# Patient Record
Sex: Female | Born: 1995 | Race: White | Hispanic: No | Marital: Single | Smoking: Current every day smoker
Health system: Southern US, Community
[De-identification: ages and names within clinical notes are randomized; demographics above are authoritative.]

## PROBLEM LIST (undated history)

## (undated) DIAGNOSIS — Z789 Other specified health status: Secondary | ICD-10-CM

## (undated) HISTORY — PX: NO PAST SURGERIES: SHX2092

## (undated) HISTORY — DX: Other specified health status: Z78.9

---
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
## 2014-01-02 ENCOUNTER — Ambulatory Visit (INDEPENDENT_AMBULATORY_CARE_PROVIDER_SITE_OTHER): Payer: Medicaid Other | Admitting: Obstetrics & Gynecology

## 2014-01-02 VITALS — BP 118/64 | Wt 136.0 lb

## 2014-01-02 DIAGNOSIS — Z1389 Encounter for screening for other disorder: Secondary | ICD-10-CM

## 2014-01-02 DIAGNOSIS — Z3400 Encounter for supervision of normal first pregnancy, unspecified trimester: Secondary | ICD-10-CM

## 2014-01-02 DIAGNOSIS — Z331 Pregnant state, incidental: Secondary | ICD-10-CM

## 2014-01-02 LAB — POCT URINALYSIS DIPSTICK
Glucose, UA: NEGATIVE
Ketones, UA: NEGATIVE
Leukocytes, UA: NEGATIVE
Nitrite, UA: NEGATIVE
Protein, UA: NEGATIVE
RBC UA: NEGATIVE

## 2014-01-02 MED ORDER — CONCEPT DHA 53.5-38-1 MG PO CAPS: 1.0000 | ORAL_CAPSULE | Freq: Every day | ORAL | Status: DC

## 2014-01-02 NOTE — Progress Notes (Signed)
BP weight and urine results all reviewed and noted. Patient reports good fetal movement, denies any bleeding and no rupture of membranes symptoms or regular contractions. Patient is without complaints. All questions were answered.  

## 2014-01-23 ENCOUNTER — Other Ambulatory Visit: Payer: Medicaid Other

## 2014-01-23 ENCOUNTER — Encounter: Payer: Medicaid Other | Admitting: Obstetrics and Gynecology

## 2014-02-11 ENCOUNTER — Other Ambulatory Visit: Payer: Medicaid Other

## 2014-02-11 ENCOUNTER — Ambulatory Visit (INDEPENDENT_AMBULATORY_CARE_PROVIDER_SITE_OTHER): Payer: Medicaid Other | Admitting: Advanced Practice Midwife

## 2014-02-11 ENCOUNTER — Encounter: Payer: Self-pay | Admitting: Advanced Practice Midwife

## 2014-02-11 VITALS — BP 104/84 | Wt 143.0 lb

## 2014-02-11 DIAGNOSIS — Z3400 Encounter for supervision of normal first pregnancy, unspecified trimester: Secondary | ICD-10-CM

## 2014-02-11 DIAGNOSIS — R42 Dizziness and giddiness: Secondary | ICD-10-CM

## 2014-02-11 DIAGNOSIS — Z131 Encounter for screening for diabetes mellitus: Secondary | ICD-10-CM

## 2014-02-11 DIAGNOSIS — Z1389 Encounter for screening for other disorder: Secondary | ICD-10-CM

## 2014-02-11 DIAGNOSIS — Z331 Pregnant state, incidental: Secondary | ICD-10-CM

## 2014-02-11 LAB — POCT URINALYSIS DIPSTICK
GLUCOSE UA: NEGATIVE
Ketones, UA: NEGATIVE
LEUKOCYTES UA: NEGATIVE
NITRITE UA: NEGATIVE

## 2014-02-11 LAB — CBC
HEMATOCRIT: 34.6 % — AB (ref 36.0–46.0)
Hemoglobin: 12.1 g/dL (ref 12.0–15.0)
MCH: 32.6 pg (ref 26.0–34.0)
MCHC: 35 g/dL (ref 30.0–36.0)
MCV: 93.3 fL (ref 78.0–100.0)
Platelets: 278 10*3/uL (ref 150–400)
RBC: 3.71 MIL/uL — ABNORMAL LOW (ref 3.87–5.11)
RDW: 14 % (ref 11.5–15.5)
WBC: 14.5 10*3/uL — ABNORMAL HIGH (ref 4.0–10.5)

## 2014-02-11 LAB — GLUCOSE, POCT (MANUAL RESULT ENTRY): POC GLUCOSE: 84 mg/dL (ref 70–99)

## 2014-02-11 NOTE — Progress Notes (Signed)
Doing PN2.    No c/o at this time except round ligament pain.  Eats "all the time"  Discussed weight gain.  Routine questions about pregnancy answered.  F/U in 2 weeks for Low-risk ob appt .

## 2014-02-12 LAB — HIV ANTIBODY (ROUTINE TESTING W REFLEX): HIV 1&2 Ab, 4th Generation: NONREACTIVE

## 2014-02-12 LAB — GLUCOSE TOLERANCE, 2 HOURS W/ 1HR
Glucose, 1 hour: 136 mg/dL (ref 70–170)
Glucose, 2 hour: 89 mg/dL (ref 70–139)
Glucose, Fasting: 79 mg/dL (ref 70–99)

## 2014-02-12 LAB — ANTIBODY SCREEN: ANTIBODY SCREEN: NEGATIVE

## 2014-02-12 LAB — HSV 2 ANTIBODY, IGG: HSV 2 GLYCOPROTEIN G AB, IGG: 0.13 IV

## 2014-02-12 LAB — RPR

## 2014-02-13 ENCOUNTER — Encounter: Payer: Self-pay | Admitting: Advanced Practice Midwife

## 2014-02-20 ENCOUNTER — Encounter (HOSPITAL_COMMUNITY): Payer: Self-pay | Admitting: Emergency Medicine

## 2014-02-20 ENCOUNTER — Emergency Department (HOSPITAL_COMMUNITY)
Admission: EM | Admit: 2014-02-20 | Discharge: 2014-02-20 | Disposition: A | Discharge status: Home / Self Care | Payer: Medicaid Other | Attending: Emergency Medicine | Admitting: Emergency Medicine

## 2014-02-20 DIAGNOSIS — M545 Low back pain, unspecified: Secondary | ICD-10-CM

## 2014-02-20 DIAGNOSIS — O9989 Other specified diseases and conditions complicating pregnancy, childbirth and the puerperium: Principal | ICD-10-CM

## 2014-02-20 DIAGNOSIS — M259 Joint disorder, unspecified: Secondary | ICD-10-CM | POA: Insufficient documentation

## 2014-02-20 DIAGNOSIS — Z79899 Other long term (current) drug therapy: Secondary | ICD-10-CM | POA: Insufficient documentation

## 2014-02-20 DIAGNOSIS — Z3490 Encounter for supervision of normal pregnancy, unspecified, unspecified trimester: Secondary | ICD-10-CM

## 2014-02-20 DIAGNOSIS — M899 Disorder of bone, unspecified: Principal | ICD-10-CM | POA: Insufficient documentation

## 2014-02-20 DIAGNOSIS — O99330 Smoking (tobacco) complicating pregnancy, unspecified trimester: Secondary | ICD-10-CM | POA: Insufficient documentation

## 2014-02-20 MED ORDER — CYCLOBENZAPRINE HCL 10 MG PO TABS: 10.0000 mg | ORAL_TABLET | Freq: Two times a day (BID) | ORAL | Status: DC | PRN

## 2014-02-20 NOTE — Discharge Instructions (Signed)

## 2014-02-20 NOTE — ED Notes (Signed)
Pt. Placed on fetal monitor. [facility] On monitor.

## 2014-02-20 NOTE — ED Notes (Addendum)
Pt. Reports waking up with lower back pain. Pt. Reports sharp pain to lower back. Pt. Reports baby is moving. Pt. Denies nausea/vomiting/ diarrhea.

## 2014-02-20 NOTE — ED Notes (Addendum)
Pt. Is pregnant due August 5th. Pt. Reports waking up with back pain and abdominal cramping.

## 2014-02-20 NOTE — ED Provider Notes (Signed)
CSN: [id number]     Arrival date & time 02/20/14  0428 History   First MD Initiated Contact with Patient 02/20/14 0441     Chief Complaint  Patient presents with  . Back Pain     (Consider location/radiation/quality/duration/timing/severity/associated sxs/prior Treatment) HPI Patient states she woke one hour ago with left-sided lower back spasms. She describes them as sharp. She is roughly [redacted] weeks pregnant. She's had no nausea, vomiting or diarrhea. She denies any loss of fluid or vaginal bleeding. Fetal movements were noted earlier today. She's had no numbness or weakness. Denies any urinary symptoms. No fevers or chills.  Past Medical History  Diagnosis Date  . Medical history non-contributory    Past Surgical History  Procedure Laterality Date  . No past surgeries     Family History  Problem Relation Age of Onset  . Cancer Maternal Grandmother    History  Substance Use Topics  . Smoking status: Current Every Day Smoker -- 0.25 packs/day for 5 years  . Smokeless tobacco: Never Used  . Alcohol Use: No   OB History   Grav Para Term Preterm Abortions TAB SAB Ect Mult Living   1              Review of Systems  Constitutional: Negative for fever and chills.  Gastrointestinal: Negative for nausea, vomiting, abdominal pain and diarrhea.  Genitourinary: Negative for dysuria, vaginal bleeding, vaginal discharge and pelvic pain.  Musculoskeletal: Positive for back pain. Negative for neck pain and neck stiffness.  Skin: Negative for rash and wound.  All other systems reviewed and are negative.     Allergies  Review of patient's allergies indicates no known allergies.  Home Medications   Prior to Admission medications   Medication Sig Start Date End Date Taking? Authorizing Provider  Doxylamine-Pyridoxine (DICLEGIS) 10-10 MG TBEC Take 10 mg by mouth See admin instructions. 10/07/13   Marge Duncans, CNM  Prenat-FeFum-Doc-FA-DHA w/o A (NEXA PLUS PO) Take by mouth  daily.    Historical Provider, MD  Prenat-FeFum-FePo-FA-Omega 3 (CONCEPT DHA) 53.5-38-1 MG CAPS Take 1 tablet by mouth daily. 01/02/14   Lazaro Arms, MD  Prenat-FeFum-FePo-FA-Omega 3 (CONCEPT DHA) 53.5-38-1 MG CAPS Take 1 tablet by mouth daily. 01/02/14   Lazaro Arms, MD   BP 133/79  Pulse 103  Temp(Src) 97.5 F (36.4 C) (Oral)  Resp 18  SpO2 100%  LMP 07/03/2013 Physical Exam  Nursing note and vitals reviewed. Constitutional: She is oriented to person, place, and time. She appears well-developed and well-nourished. No distress.  HENT:  Head: Normocephalic and atraumatic.  Mouth/Throat: Oropharynx is clear and moist.  Eyes: EOM are normal. Pupils are equal, round, and reactive to light.  Neck: Normal range of motion. Neck supple.  Cardiovascular: Normal rate and regular rhythm.   Pulmonary/Chest: Effort normal and breath sounds normal. No respiratory distress. She has no wheezes. She has no rales.  Abdominal: Soft. Bowel sounds are normal. She exhibits no distension and no mass. There is no tenderness. There is no rebound and no guarding.  Gravid abdomen  Genitourinary:  Cervix closed and thick by sterile manual exam.  Musculoskeletal: Normal range of motion. She exhibits no edema and no tenderness.  No lumbar tenderness or CVA tenderness bilaterally  Neurological: She is alert and oriented to person, place, and time.  Moves all extremities without deficit. Sensation grossly intact  Skin: Skin is warm and dry. No rash noted. No erythema.  Psychiatric: She has a normal mood and  affect. Her behavior is normal.    ED Course  Procedures (including critical care time) Labs Review Labs Reviewed - No data to display  Imaging Review No results found.   EKG Interpretation None      MDM   Final diagnoses:  None    Fetal heart tones in the 130's. Pain is currently resolved.  Discussed with Dr.Eure. Recommends discharge home with Flexeril. Followup with her  OB/GYN.  Return precautions given to patient. She voiced understanding.  Loren Raceravid [place] Trawick, MD 02/20/14 [phone]

## 2014-02-20 NOTE — ED Notes (Signed)
Spoke with Olegario Messier from [facility] hospital, stated that fetal heart tracing looked good and baby is reactive. EDP notified.

## 2014-02-20 NOTE — ED Notes (Signed)
Paged Dr. Despina Hidden for Dr. Ranae Palms

## 2014-02-26 ENCOUNTER — Encounter: Payer: Medicaid Other | Admitting: Women's Health

## 2014-03-21 ENCOUNTER — Encounter: Payer: Self-pay | Admitting: Obstetrics and Gynecology

## 2014-03-21 ENCOUNTER — Ambulatory Visit (INDEPENDENT_AMBULATORY_CARE_PROVIDER_SITE_OTHER): Payer: Medicaid Other | Admitting: Obstetrics and Gynecology

## 2014-03-21 VITALS — BP 102/76 | Wt 145.0 lb

## 2014-03-21 DIAGNOSIS — F1920 Other psychoactive substance dependence, uncomplicated: Secondary | ICD-10-CM

## 2014-03-21 DIAGNOSIS — O99320 Drug use complicating pregnancy, unspecified trimester: Secondary | ICD-10-CM

## 2014-03-21 DIAGNOSIS — Z3400 Encounter for supervision of normal first pregnancy, unspecified trimester: Secondary | ICD-10-CM

## 2014-03-21 DIAGNOSIS — Z331 Pregnant state, incidental: Secondary | ICD-10-CM | POA: Insufficient documentation

## 2014-03-21 DIAGNOSIS — Z1389 Encounter for screening for other disorder: Secondary | ICD-10-CM

## 2014-03-21 DIAGNOSIS — Z3403 Encounter for supervision of normal first pregnancy, third trimester: Secondary | ICD-10-CM

## 2014-03-21 LAB — POCT URINALYSIS DIPSTICK
Glucose, UA: NEGATIVE
Ketones, UA: NEGATIVE
Leukocytes, UA: NEGATIVE
NITRITE UA: NEGATIVE
PROTEIN UA: NEGATIVE
RBC UA: NEGATIVE

## 2014-03-21 NOTE — Progress Notes (Signed)
G1P0 3614w2d Estimated Date of Delivery: 04/16/14  Blood pressure 102/76, weight 145 lb (65.772 kg), last menstrual period 07/03/2013.   BP weight and urine results all reviewed and noted Please refer to the obstetrical flow sheet for FHT, FHR, U/A results  Patient reports good fetal movement, denies any bleeding and no rupture of membranes symptoms or regular contractions. Patient is without complaints. All questions were answered. Tour of [employer] advised.   Plan:  Continued routine obstetrical care. Counseled patient on [place] pregnancy classes and [facility] of [facility] encouraged  Follow up in 1 week for OB appointment and examination of cervix, ;gbs, GC/Chl.

## 2014-03-21 NOTE — Progress Notes (Signed)
Pt denies any problems or concerns at this time.  

## 2014-03-21 NOTE — Patient Instructions (Signed)
[facility] 336- [phone] [address], [place], [place] [address]  [email or web address]

## 2014-03-22 LAB — DRUG SCREEN, URINE, NO CONFIRMATION
Amphetamine Screen, Ur: NEGATIVE
BARBITURATE QUANT UR: NEGATIVE
Benzodiazepines.: NEGATIVE
Cocaine Metabolites: NEGATIVE
Creatinine,U: 150.4 mg/dL
METHADONE: NEGATIVE
Marijuana Metabolite: POSITIVE — AB
OPIATE SCREEN, URINE: NEGATIVE
PROPOXYPHENE: NEGATIVE
Phencyclidine (PCP): NEGATIVE

## 2014-03-22 LAB — OXYCODONE SCREEN, UA, RFLX CONFIRM: Oxycodone Screen, Ur: NEGATIVE ng/mL

## 2014-03-31 ENCOUNTER — Ambulatory Visit (INDEPENDENT_AMBULATORY_CARE_PROVIDER_SITE_OTHER): Payer: Medicaid Other | Admitting: Obstetrics and Gynecology

## 2014-03-31 ENCOUNTER — Encounter: Payer: Self-pay | Admitting: Obstetrics and Gynecology

## 2014-03-31 VITALS — BP 116/70

## 2014-03-31 DIAGNOSIS — Z3403 Encounter for supervision of normal first pregnancy, third trimester: Secondary | ICD-10-CM

## 2014-03-31 DIAGNOSIS — Z1389 Encounter for screening for other disorder: Secondary | ICD-10-CM

## 2014-03-31 DIAGNOSIS — Z3400 Encounter for supervision of normal first pregnancy, unspecified trimester: Secondary | ICD-10-CM

## 2014-03-31 DIAGNOSIS — Z331 Pregnant state, incidental: Secondary | ICD-10-CM

## 2014-03-31 LAB — POCT URINALYSIS DIPSTICK
Glucose, UA: NEGATIVE
Ketones, UA: NEGATIVE
Leukocytes, UA: NEGATIVE
Nitrite, UA: NEGATIVE
RBC UA: NEGATIVE

## 2014-03-31 LAB — OB RESULTS CONSOLE GC/CHLAMYDIA
CHLAMYDIA, DNA PROBE: NEGATIVE
GC PROBE AMP, GENITAL: NEGATIVE

## 2014-03-31 NOTE — Progress Notes (Signed)
Pt states that she has mid and lower back pain that started yesterday, just an achy feeling.

## 2014-03-31 NOTE — Progress Notes (Signed)
G1P0 4919w5d Estimated Date of Delivery: 04/16/14  Blood pressure 116/70, last menstrual period 07/03/2013.   BP weight and urine results all reviewed and noted.  Please refer to the obstetrical flow sheet for the fundal height and fetal heart rate documentation:  Patient reports good fetal movement, denies any bleeding and no rupture of membranes symptoms or regular contractions. Patient is complaining of a constant, unchanged vaginal discharge which she describes as white in color. She also complains of intermittent lower back pain.  All questions were answered. Cx: long closed posterior, vertex. GBS collected Plan:  Continued routine obstetrical care,   Follow up in 1 weeks for OB appointment,

## 2014-04-01 LAB — GC/CHLAMYDIA PROBE AMP
CT Probe RNA: NEGATIVE
GC Probe RNA: NEGATIVE

## 2014-04-02 ENCOUNTER — Encounter: Payer: Self-pay | Admitting: Obstetrics and Gynecology

## 2014-04-02 LAB — STREP B DNA PROBE: GBSP: NOT DETECTED

## 2014-04-07 ENCOUNTER — Encounter: Payer: Medicaid Other | Admitting: Obstetrics and Gynecology

## 2014-04-11 ENCOUNTER — Telehealth: Payer: Self-pay | Admitting: *Deleted

## 2014-04-11 NOTE — Telephone Encounter (Signed)
Pt c/o lower abdominal and lower leg pain, +FM, no gush of fluids, no contractions. Pt encouraged to push fluids, take tylenol, and rest if no improvement to call office back. Pt verbalized understanding. Pt states missed her appt on Monday and needs to reschedule for next week. Call transferred to front staff for appt to be scheduled.

## 2014-04-14 ENCOUNTER — Telehealth: Payer: Self-pay | Admitting: Obstetrics and Gynecology

## 2014-04-14 ENCOUNTER — Ambulatory Visit (INDEPENDENT_AMBULATORY_CARE_PROVIDER_SITE_OTHER): Payer: Medicaid Other | Admitting: Obstetrics & Gynecology

## 2014-04-14 VITALS — BP 120/76 | Wt 148.0 lb

## 2014-04-14 DIAGNOSIS — F1920 Other psychoactive substance dependence, uncomplicated: Secondary | ICD-10-CM

## 2014-04-14 DIAGNOSIS — Z331 Pregnant state, incidental: Secondary | ICD-10-CM

## 2014-04-14 DIAGNOSIS — O99320 Drug use complicating pregnancy, unspecified trimester: Principal | ICD-10-CM

## 2014-04-14 DIAGNOSIS — Z1389 Encounter for screening for other disorder: Secondary | ICD-10-CM

## 2014-04-14 LAB — POCT URINALYSIS DIPSTICK: Leukocytes, UA: NEGATIVE

## 2014-04-14 NOTE — Progress Notes (Signed)
G1P0 2070w5d Estimated Date of Delivery: 04/16/14  Blood pressure 120/76, weight 148 lb (67.132 kg), last menstrual period 07/03/2013.   BP weight and urine results all reviewed and noted.  Please refer to the obstetrical flow sheet for the fundal height and fetal heart rate documentation:  Patient reports good fetal movement, denies any bleeding and no rupture of membranes symptoms or regular contractions. Patient is without complaints. All questions were answered.  Plan:  Continued routine obstetrical care,   Follow up in 1 weeks for OB appointment, routine

## 2014-04-14 NOTE — Telephone Encounter (Signed)
Dr. Despina HiddenEure informed of anonymous called this morning stating pt missing appt due to drug use. Urine sent out for UDS. UDS + THC 7/10.

## 2014-04-15 ENCOUNTER — Encounter (HOSPITAL_COMMUNITY): Payer: Self-pay | Admitting: *Deleted

## 2014-04-15 ENCOUNTER — Inpatient Hospital Stay (HOSPITAL_COMMUNITY): Payer: Medicaid Other | Admitting: Anesthesiology

## 2014-04-15 ENCOUNTER — Inpatient Hospital Stay (HOSPITAL_COMMUNITY)
Admission: AD | Admit: 2014-04-15 | Discharge: 2014-04-17 | DRG: 775 | Disposition: A | Discharge status: Home / Self Care | Payer: Medicaid Other | Source: Ambulatory Visit | Attending: Obstetrics & Gynecology | Admitting: Obstetrics & Gynecology

## 2014-04-15 ENCOUNTER — Encounter (HOSPITAL_COMMUNITY): Payer: Medicaid Other | Admitting: Anesthesiology

## 2014-04-15 DIAGNOSIS — IMO0001 Reserved for inherently not codable concepts without codable children: Secondary | ICD-10-CM

## 2014-04-15 DIAGNOSIS — O99334 Smoking (tobacco) complicating childbirth: Secondary | ICD-10-CM | POA: Diagnosis present

## 2014-04-15 DIAGNOSIS — F1990 Other psychoactive substance use, unspecified, uncomplicated: Secondary | ICD-10-CM

## 2014-04-15 DIAGNOSIS — O479 False labor, unspecified: Secondary | ICD-10-CM | POA: Diagnosis present

## 2014-04-15 LAB — RAPID URINE DRUG SCREEN, HOSP PERFORMED
AMPHETAMINES: NOT DETECTED
Barbiturates: NOT DETECTED
Benzodiazepines: NOT DETECTED
COCAINE: NOT DETECTED
Opiates: NOT DETECTED
TETRAHYDROCANNABINOL: POSITIVE — AB

## 2014-04-15 LAB — CBC
HEMATOCRIT: 38.2 % (ref 36.0–46.0)
HEMOGLOBIN: 13.1 g/dL (ref 12.0–15.0)
MCH: 32.3 pg (ref 26.0–34.0)
MCHC: 34.3 g/dL (ref 30.0–36.0)
MCV: 94.1 fL (ref 78.0–100.0)
Platelets: 221 10*3/uL (ref 150–400)
RBC: 4.06 MIL/uL (ref 3.87–5.11)
RDW: 13.6 % (ref 11.5–15.5)
WBC: 19.8 10*3/uL — AB (ref 4.0–10.5)

## 2014-04-15 LAB — DRUG SCREEN, URINE, NO CONFIRMATION
Amphetamine Screen, Ur: NEGATIVE
BENZODIAZEPINES.: NEGATIVE
Barbiturate Quant, Ur: NEGATIVE
CREATININE, U: 58.9 mg/dL
Cocaine Metabolites: NEGATIVE
Marijuana Metabolite: POSITIVE — AB
Methadone: NEGATIVE
Opiate Screen, Urine: NEGATIVE
PHENCYCLIDINE (PCP): NEGATIVE
PROPOXYPHENE: NEGATIVE

## 2014-04-15 LAB — RPR

## 2014-04-15 MED ORDER — OXYTOCIN 40 UNITS IN LACTATED RINGERS INFUSION - SIMPLE MED
62.5000 mL/h | INTRAVENOUS | Status: DC
Start: 2014-04-15 — End: 2014-04-15

## 2014-04-15 MED ORDER — ONDANSETRON HCL 4 MG/2ML IJ SOLN: 4.0000 mg | Freq: Four times a day (QID) | INTRAMUSCULAR | Status: DC | PRN

## 2014-04-15 MED ORDER — LIDOCAINE HCL (PF) 1 % IJ SOLN
INTRAMUSCULAR | Status: AC
  Filled 2014-04-15: qty 30

## 2014-04-15 MED ORDER — LACTATED RINGERS IV SOLN
INTRAVENOUS | Status: DC
  Administered 2014-04-15 (×3): via INTRAVENOUS

## 2014-04-15 MED ORDER — TETANUS-DIPHTH-ACELL PERTUSSIS 5-2.5-18.5 LF-MCG/0.5 IM SUSP: 0.5000 mL | Freq: Once | INTRAMUSCULAR | Status: DC

## 2014-04-15 MED ORDER — IBUPROFEN 600 MG PO TABS: 600.0000 mg | ORAL_TABLET | Freq: Four times a day (QID) | ORAL | Status: DC | PRN

## 2014-04-15 MED ORDER — LIDOCAINE HCL (PF) 1 % IJ SOLN
30.0000 mL | INTRAMUSCULAR | Status: AC | PRN
  Administered 2014-04-15 (×2): 5 mL via SUBCUTANEOUS

## 2014-04-15 MED ORDER — ONDANSETRON HCL 4 MG PO TABS: 4.0000 mg | ORAL_TABLET | ORAL | Status: DC | PRN

## 2014-04-15 MED ORDER — ACETAMINOPHEN 325 MG PO TABS: 650.0000 mg | ORAL_TABLET | ORAL | Status: DC | PRN

## 2014-04-15 MED ORDER — IBUPROFEN 600 MG PO TABS
600.0000 mg | ORAL_TABLET | Freq: Four times a day (QID) | ORAL | Status: DC
  Administered 2014-04-15 – 2014-04-17 (×6): 600 mg via ORAL
  Filled 2014-04-15 (×6): qty 1

## 2014-04-15 MED ORDER — LANOLIN HYDROUS EX OINT: TOPICAL_OINTMENT | CUTANEOUS | Status: DC | PRN

## 2014-04-15 MED ORDER — OXYTOCIN 40 UNITS IN LACTATED RINGERS INFUSION - SIMPLE MED
1.0000 m[IU]/min | INTRAVENOUS | Status: DC
  Administered 2014-04-15: 2 m[IU]/min via INTRAVENOUS
  Filled 2014-04-15: qty 1000

## 2014-04-15 MED ORDER — DIPHENHYDRAMINE HCL 25 MG PO CAPS: 25.0000 mg | ORAL_CAPSULE | Freq: Four times a day (QID) | ORAL | Status: DC | PRN

## 2014-04-15 MED ORDER — SIMETHICONE 80 MG PO CHEW: 80.0000 mg | CHEWABLE_TABLET | ORAL | Status: DC | PRN

## 2014-04-15 MED ORDER — OXYCODONE-ACETAMINOPHEN 5-325 MG PO TABS
1.0000 | ORAL_TABLET | ORAL | Status: DC | PRN
  Administered 2014-04-17: 1 via ORAL
  Filled 2014-04-15: qty 1

## 2014-04-15 MED ORDER — LACTATED RINGERS IV SOLN: 500.0000 mL | Freq: Once | INTRAVENOUS | Status: DC

## 2014-04-15 MED ORDER — BUTORPHANOL TARTRATE 1 MG/ML IJ SOLN
1.0000 mg | INTRAMUSCULAR | Status: DC | PRN
  Administered 2014-04-15: 1 mg via INTRAVENOUS
  Filled 2014-04-15: qty 1

## 2014-04-15 MED ORDER — OXYCODONE-ACETAMINOPHEN 5-325 MG PO TABS: 1.0000 | ORAL_TABLET | ORAL | Status: DC | PRN

## 2014-04-15 MED ORDER — BENZOCAINE-MENTHOL 20-0.5 % EX AERO
1.0000 "application " | INHALATION_SPRAY | CUTANEOUS | Status: DC | PRN
  Filled 2014-04-15: qty 56

## 2014-04-15 MED ORDER — ZOLPIDEM TARTRATE 5 MG PO TABS: 5.0000 mg | ORAL_TABLET | Freq: Every evening | ORAL | Status: DC | PRN

## 2014-04-15 MED ORDER — TERBUTALINE SULFATE 1 MG/ML IJ SOLN: 0.2500 mg | Freq: Once | INTRAMUSCULAR | Status: DC | PRN

## 2014-04-15 MED ORDER — PRENATAL MULTIVITAMIN CH
1.0000 | ORAL_TABLET | Freq: Every day | ORAL | Status: DC
  Administered 2014-04-16 – 2014-04-17 (×2): 1 via ORAL
  Filled 2014-04-15 (×2): qty 1

## 2014-04-15 MED ORDER — ONDANSETRON HCL 4 MG/2ML IJ SOLN: 4.0000 mg | INTRAMUSCULAR | Status: DC | PRN

## 2014-04-15 MED ORDER — DIPHENHYDRAMINE HCL 50 MG/ML IJ SOLN: 12.5000 mg | INTRAMUSCULAR | Status: DC | PRN

## 2014-04-15 MED ORDER — EPHEDRINE 5 MG/ML INJ
10.0000 mg | INTRAVENOUS | Status: DC | PRN
  Filled 2014-04-15: qty 2

## 2014-04-15 MED ORDER — LACTATED RINGERS IV SOLN: 500.0000 mL | INTRAVENOUS | Status: DC | PRN

## 2014-04-15 MED ORDER — FENTANYL 2.5 MCG/ML BUPIVACAINE 1/10 % EPIDURAL INFUSION (WH - ANES)
14.0000 mL/h | INTRAMUSCULAR | Status: DC | PRN
  Administered 2014-04-15 (×3): 14 mL/h via EPIDURAL
  Filled 2014-04-15 (×3): qty 125

## 2014-04-15 MED ORDER — PHENYLEPHRINE 40 MCG/ML (10ML) SYRINGE FOR IV PUSH (FOR BLOOD PRESSURE SUPPORT)
80.0000 ug | PREFILLED_SYRINGE | INTRAVENOUS | Status: DC | PRN
  Filled 2014-04-15: qty 2
  Filled 2014-04-15: qty 10

## 2014-04-15 MED ORDER — OXYTOCIN BOLUS FROM INFUSION
500.0000 mL | INTRAVENOUS | Status: DC
Start: 2014-04-15 — End: 2014-04-15

## 2014-04-15 MED ORDER — DIBUCAINE 1 % RE OINT: 1.0000 "application " | TOPICAL_OINTMENT | RECTAL | Status: DC | PRN

## 2014-04-15 MED ORDER — PRENATAL MULTIVITAMIN CH: 1.0000 | ORAL_TABLET | Freq: Every day | ORAL | Status: DC

## 2014-04-15 MED ORDER — CITRIC ACID-SODIUM CITRATE 334-500 MG/5ML PO SOLN: 30.0000 mL | ORAL | Status: DC | PRN

## 2014-04-15 MED ORDER — PHENYLEPHRINE 40 MCG/ML (10ML) SYRINGE FOR IV PUSH (FOR BLOOD PRESSURE SUPPORT)
80.0000 ug | PREFILLED_SYRINGE | INTRAVENOUS | Status: DC | PRN
  Filled 2014-04-15: qty 2

## 2014-04-15 MED ORDER — SENNOSIDES-DOCUSATE SODIUM 8.6-50 MG PO TABS
2.0000 | ORAL_TABLET | ORAL | Status: DC
  Administered 2014-04-15 – 2014-04-17 (×2): 2 via ORAL
  Filled 2014-04-15 (×2): qty 2

## 2014-04-15 MED ORDER — WITCH HAZEL-GLYCERIN EX PADS: 1.0000 "application " | MEDICATED_PAD | CUTANEOUS | Status: DC | PRN

## 2014-04-15 NOTE — Progress Notes (Signed)
Heather HerrlichMaura S Hickman is a 18 y.o. G1P0 at 5872w6d by ultrasound admitted for active labor  Subjective: Pt. Feeling some more discomfort / pressure now. No other complaints.   Objective: BP 113/56  Pulse 101  Temp(Src) 98.1 F (36.7 C) (Oral)  Resp 18  Ht 5\' 1"  (1.549 m)  Wt 66.225 kg (146 lb)  BMI 27.60 kg/m2  SpO2 87%  LMP 07/03/2013      FHT:  FHR: 135 bpm, variability: moderate,  accelerations:  Present,  decelerations:  Absent UC:   regular, every 4 minutes SVE:   Dilation: 7, Effacement %: 90 Station: -1 Labs: Lab Results  Component Value Date   WBC 19.8* 04/15/2014   HGB 13.1 04/15/2014   HCT 38.2 04/15/2014   MCV 94.1 04/15/2014   PLT 221 04/15/2014    Assessment / Plan: Spontaneous labor, progressing normally  Labor: Progressing normally at next recheck will AROM if no progress  Preeclampsia:  no signs or symptoms of toxicity Fetal Wellbeing:  Category I Pain Control:  Epidural I/D:  n/a Anticipated MOD:  NSVD  [name] 04/15/2014, 10:31 AM

## 2014-04-15 NOTE — MAU Note (Signed)
contractions 

## 2014-04-15 NOTE — MAU Provider Note (Signed)
Chief Complaint:  Labor Eval   First Provider Initiated Contact with Patient 04/15/14 0101    Please see H&P and A&P from same day.

## 2014-04-15 NOTE — Anesthesia Procedure Notes (Signed)
Epidural Patient location during procedure: OB Start time: 04/15/2014 4:21 AM End time: 04/15/2014 4:56 AM  Staffing Anesthesiologist: Darlinda Bellows, CHRIS Performed by: anesthesiologist   Preanesthetic Checklist Completed: patient identified, surgical consent, pre-op evaluation, timeout performed, IV checked, risks and benefits discussed and monitors and equipment checked  Epidural Patient position: sitting Prep: site prepped and draped and DuraPrep Patient monitoring: heart rate, cardiac monitor, continuous pulse ox and blood pressure Approach: midline Location: L4-L5 Injection technique: LOR saline  Needle:  Needle type: Tuohy  Needle gauge: 17 G Needle length: 9 cm Needle insertion depth: 5 cm Catheter type: closed end flexible Catheter size: 19 Gauge Catheter at skin depth: 12 cm Test dose: Other  Assessment Events: blood not aspirated, injection not painful, no injection resistance, negative IV test and no paresthesia  Additional Notes H+P and labs checked, risks and benefits discussed with the patient, consent obtained, procedure tolerated well and without complications.  Reason for block:procedure for pain

## 2014-04-15 NOTE — H&P (Signed)
LABOR ADMISSION HISTORY AND PHYSICAL  [name] is a 18 y.o. female G1P0 with IUP at [redacted]w[redacted]d by LMP presenting for active labor. Pt presents w/ contractions. She reports +FMs, No LOF, no VB, no blurry vision, headaches or peripheral edema, and RUQ pain. She desires an epidural for labor pain control. She plans on breast feeding. She request potentially depo for birth control. Epidural desired.  Dating: By LMP --->  Estimated Date of Delivery: 04/16/14  Sono:   @ 13 w - placenta previa @[redacted]w[redacted]d , CWD, normal anatomy, placenta previa resolved  Prenatal History/Complications: + Utox for benzos and MJ Placenta previa noted on 13 wk [place], now resolved on 20 wk [place]  Past Medical History: Past Medical History  Diagnosis Date  . Medical history non-contributory     Past Surgical History: Past Surgical History  Procedure Laterality Date  . No past surgeries      Obstetrical History: OB History   Grav Para Term Preterm Abortions TAB SAB Ect Mult Living   1                Gynecological History: OB History   Grav Para Term Preterm Abortions TAB SAB Ect Mult Living   1               Social History: History   Social History  . Marital Status: Single    Spouse Name: N/A    Number of Children: N/A  . Years of Education: N/A   Social History Main Topics  . Smoking status: Current Every Day Smoker -- 0.25 packs/day for 5 years  . Smokeless tobacco: Never Used  . Alcohol Use: No  . Drug Use: No  . Sexual Activity: Yes    Birth Control/ Protection: None   Other Topics Concern  . None   Social History Narrative  . None    Family History: Family History  Problem Relation Age of Onset  . Cancer Maternal Grandmother     Allergies: No Known Allergies  Prescriptions prior to admission  Medication Sig Dispense Refill  . Prenat-FeFum-FePo-FA-Omega 3 (CONCEPT DHA) 53.5-38-1 MG CAPS Take 1 tablet by mouth daily.  30 capsule  11     Review of Systems   All systems  reviewed and negative except as stated in HPI  Blood pressure 120/82, pulse 84, temperature 97.8 F (36.6 C), temperature source Oral, resp. rate 18, height 5\' 1"  (1.549 m), weight 147 lb (66.679 kg), last menstrual period 07/03/2013, SpO2 99.00%. General appearance: alert and cooperative Lungs: clear to auscultation bilaterally Heart: regular rate and rhythm Abdomen: soft, non-tender; bowel sounds normal Pelvic: adequate; 2/80/-1 --> 4/80/-1 in 1 hour Extremities: Homans sign is negative, no sign of DVT Presentation: cephalic Fetal monitoringBaseline: 120 bpm, Variability: Good {> 6 bpm) and Accelerations: Reactive Uterine activityDate/time of onset: 8/3 @5pm , Frequency: Every 2-3 minutes and Duration: 30 seconds Dilation: 4 Effacement (%): 80 Station: -1 Exam by:: DR Magic Mohler   Prenatal labs: ABO, Rh: A/POS/-- (12/22 1145) Antibody: NEG (06/02 0903) Rubella:   RPR: NON REAC (06/02 0903)  HBsAg: NEGATIVE (12/22 1145)  HIV: NONREACTIVE (06/02 0903)  GBS: NOT DETECTED (07/20 1111)  1 hr Glucola 76 Genetic screening  Normal Anatomy US Normal   Prenatal Transfer Tool  Maternal Diabetes: No Genetic Screening: Normal Maternal Ultrasounds/Referrals: Normal Fetal Ultrasounds or other Referrals:  None Maternal Substance Abuse:  Yes:  Type: Marijuana, Prescription drugs, Other:  Benzos Significant Maternal Medications:  None Significant Maternal Lab Results: Lab values  include: Group B Strep negative     Results for orders placed in visit on 04/14/14 (from the past 24 hour(s))  POCT URINALYSIS DIPSTICK   Collection Time    04/14/14  9:05 AM      Result Value Ref Range   Color, UA clear     Clarity, UA yellow     Glucose, UA none     Bilirubin, UA       Ketones, UA none     Spec Grav, UA       Blood, UA trace     pH, UA       Protein, UA none     Urobilinogen, UA       Nitrite, UA none     Leukocytes, UA Negative      Patient Active Problem List   Diagnosis Date  Noted  . Pregnant state, incidental 03/21/2014  . Placenta previa antepartum 10/07/2013  . Illicit drug use 09/03/2013  . Supervision of normal first pregnancy 09/02/2013    Assessment: [name] is a 18 y.o. G1P0 at 2850w6d here for active labor at 7650w6d by LMP w Cat 1 strip.  #Labor: Expectant Management #Pain:  IV pain meds, ? epidural #FWB: Cat I, reassuring  #ID:  GBS neg #MOF: Breast #MOC:Depo? #Circ:  N/A  Elita BooneRoberts, Dawn Kiper C 04/15/2014, 2:24 AM

## 2014-04-15 NOTE — MAU Note (Signed)
PT  SAYS SHE WAS  AT OFFICE TODAY- 2  CM  .  STARTED HURTING   AT 10 AM  .  DENIES HSV AND  MRSA .    GBS- UNSURE

## 2014-04-15 NOTE — Progress Notes (Signed)
Patient ID: [name], female   DOB: 04/14/1996, 18 y.o.   MRN: [id number]  Labor Progress Note  ASSESSMENT:   [name] 18 y.o. G1P0 at 2437w6d in Active labor   PLAN:  1) Labor curve reviewed.       Progress: Progressing well, latent labor             Plan: Expectant Management  2) Fetal heart tracing reviewed.    Cat I  3) GBS Status - neg No results found for this basename: STREPBCULT    4) Other Problems Active Problems:   Active labor at term   SUBJECTIVE:  Continues to have pain. Requesting epidural.   OBJECTIVE:  Vital Signs: Patient Vitals for the past 2 hrs:  BP Pulse Resp SpO2  04/15/14 0452 - 81 - 98 %  04/15/14 0451 117/78 mmHg 63 18 -  04/15/14 0450 - 68 - 98 %  04/15/14 0449 - 73 - 96 %  04/15/14 0446 115/76 mmHg 74 18 94 %  04/15/14 0444 - 74 - 94 %  04/15/14 0442 - 79 - 98 %  04/15/14 0441 114/76 mmHg 73 18 -  04/15/14 0440 128/73 mmHg 79 18 -  04/15/14 0438 - 69 - 94 %  04/15/14 0437 130/83 mmHg 79 18 98 %  04/15/14 0436 126/82 mmHg 81 18 -  04/15/14 0432 - 85 - 95 %  04/15/14 0431 129/75 mmHg 88 18 -  04/15/14 0427 - 81 - 100 %  04/15/14 0422 - 36 - 87 %   SVE: Dilation: 4, Effacement (%): 80, Station: -1  FHR Monitoring Baseline Rate (A): 130 bpm   Accelerations: None Contraction Frequency (min): irregular

## 2014-04-15 NOTE — Anesthesia Preprocedure Evaluation (Signed)
Anesthesia Evaluation  Patient identified by MRN, date of birth, ID band Patient awake    Reviewed: Allergy & Precautions, H&P , NPO status , Patient's Chart, lab work & pertinent test results  History of Anesthesia Complications Negative for: history of anesthetic complications  Airway Mallampati: I TM Distance: >3 FB Neck ROM: Full    Dental  (+) Teeth Intact   Pulmonary neg shortness of breath, neg sleep apnea, neg COPDneg recent URI, Current Smoker,  breath sounds clear to auscultation        Cardiovascular negative cardio ROS  Rhythm:Regular     Neuro/Psych negative neurological ROS  negative psych ROS   GI/Hepatic negative GI ROS, Neg liver ROS,   Endo/Other  negative endocrine ROS  Renal/GU negative Renal ROS     Musculoskeletal   Abdominal   Peds  Hematology negative hematology ROS (+)   Anesthesia Other Findings   Reproductive/Obstetrics                           Anesthesia Physical Anesthesia Plan  ASA: II  Anesthesia Plan: Epidural   Post-op Pain Management:    Induction:   Airway Management Planned:   Additional Equipment:   Intra-op Plan:   Post-operative Plan:   Informed Consent: I have reviewed the patients History and Physical, chart, labs and discussed the procedure including the risks, benefits and alternatives for the proposed anesthesia with the patient or authorized representative who has indicated his/her understanding and acceptance.   Dental advisory given  Plan Discussed with: Anesthesiologist  Anesthesia Plan Comments:         Anesthesia Quick Evaluation

## 2014-04-15 NOTE — Progress Notes (Signed)
[name] is a 18 y.o. G1P0 at 1519w6d by ultrasound admitted for active labor  Subjective: Progress note times 2 with previous check at 12:30 pm. Pt. Now feeling slightly more pressure. Otherwise she is comfortable. No complaints at 12:30 or at 3:30 pm.   Objective: BP 107/71  Pulse 95  Temp(Src) 98.3 F (36.8 C) (Oral)  Resp 18  Ht 5\' 1"  (1.549 m)  Wt 66.225 kg (146 lb)  BMI 27.60 kg/m2  SpO2 87%  LMP 07/03/2013      FHT:  FHR: 135 bpm, variability: moderate,  accelerations:  Present,  decelerations:  Absent UC:   regular, every 6 minutes SVE:   Dilation: 7 Effacement (%): 90 Station: -1 Exam by:: dr. Prescott Gummalencon  Labs: Lab Results  Component Value Date   WBC 19.8* 04/15/2014   HGB 13.1 04/15/2014   HCT 38.2 04/15/2014   MCV 94.1 04/15/2014   PLT 221 04/15/2014    Assessment / Plan: Augmentation of labor, progressing well  Labor: Progressing normally  Pitocin added at this time. Will reassess and change augmentation plan as necessary Preeclampsia:  no signs or symptoms of toxicity Fetal Wellbeing:  Category I Pain Control:  Epidural I/D:  n/a Anticipated MOD:  NSVD  [name] 04/15/2014, 3:24 PM

## 2014-04-15 NOTE — Progress Notes (Signed)
Patient ID: Heather HerrlichMaura S Hickman, female   DOB: 03/18/1996, 18 y.o.   MRN: [id number] Patient ID: Heather HerrlichMaura S Hickman, female   DOB: 03/27/1996, 18 y.o.   MRN: [id number]  Labor Progress Note  ASSESSMENT:   [name] 18 y.o. G1P0 at 2939w6d in Active labor   PLAN:  1) Labor curve reviewed.       Progress: Progressing well             Plan: Expectant Management  2) Fetal heart tracing reviewed.    Cat I  3) GBS Status - neg No results found for this basename: STREPBCULT    4) Other Problems Active Problems:   Active labor at term   SUBJECTIVE:  Endorses pain releif w/ epidural.   OBJECTIVE:  Vital Signs:  Patient Vitals for the past 2 hrs:  BP Pulse Resp  04/15/14 0831 118/70 mmHg 83 18  04/15/14 0801 113/80 mmHg 107 -  04/15/14 0731 117/63 mmHg 94 18   SVE: Dilation: 5.5, Effacement (%): 90, Station: 0  FHR Monitoring Baseline Rate (A): 140 bpm   Accelerations: 15 x 15 Contraction Frequency (min): 3-4

## 2014-04-16 ENCOUNTER — Encounter (HOSPITAL_COMMUNITY): Payer: Self-pay | Admitting: *Deleted

## 2014-04-16 LAB — CBC
HCT: 31.4 % — ABNORMAL LOW (ref 36.0–46.0)
Hemoglobin: 10.4 g/dL — ABNORMAL LOW (ref 12.0–15.0)
MCH: 31.1 pg (ref 26.0–34.0)
MCHC: 33.1 g/dL (ref 30.0–36.0)
MCV: 94 fL (ref 78.0–100.0)
PLATELETS: 205 10*3/uL (ref 150–400)
RBC: 3.34 MIL/uL — ABNORMAL LOW (ref 3.87–5.11)
RDW: 13.7 % (ref 11.5–15.5)
WBC: 22.3 10*3/uL — AB (ref 4.0–10.5)

## 2014-04-16 NOTE — H&P (Signed)
Attestation of Attending Supervision of Fellow: Evaluation and management procedures were performed by the Fellow under my supervision and collaboration.  I have reviewed the Fellow's note and chart, and I agree with the management and plan.    

## 2014-04-16 NOTE — Discharge Instructions (Signed)

## 2014-04-16 NOTE — Anesthesia Postprocedure Evaluation (Signed)
  Anesthesia Post-op Note  Patient: [name]  Procedure(s) Performed: * No procedures listed *  Patient Location: Mother/Baby  Anesthesia Type:Epidural  Level of Consciousness: awake  Airway and Oxygen Therapy: Patient Spontanous Breathing  Post-op Pain: none  Post-op Assessment: Patient's Cardiovascular Status Stable, Respiratory Function Stable, Patent Airway, No signs of Nausea or vomiting, Adequate PO intake, Pain level controlled, No headache, No backache, No residual numbness and No residual motor weakness  Post-op Vital Signs: Reviewed and stable  Last Vitals:  Filed Vitals:   04/16/14 0330  BP: 95/75  Pulse: 67  Temp: 36.5 C  Resp: 16    Complications: No apparent anesthesia complications

## 2014-04-16 NOTE — Discharge Summary (Signed)
Obstetric Discharge Summary Reason for Admission: onset of labor Prenatal Procedures: none Intrapartum Procedures: spontaneous vaginal delivery Postpartum Procedures: none Complications-Operative and Postpartum: none Hemoglobin  Date Value Ref Range Status  04/16/2014 10.4* 12.0 - 15.0 g/dL Final     DELTA CHECK NOTED     REPEATED TO VERIFY     HCT  Date Value Ref Range Status  04/16/2014 31.4* 36.0 - 46.0 % Final    18 year old female G1P0 now s/p vaginal delivery at 4964w0d gestation. Patient presented with contractions and was found to be in active labor. On 04/15/2014 at 2000 hours, patient had a vaginal delivery with vaginal laceration that was repaired in usual fashion. Patient had good recovery without complication after delivery. Pain and bleeding were controlled. Patient desires mirena for contraception postpartum. Followup with [facility] 4 weeks.  Physical Exam:  General: alert, cooperative and no distress Lochia: appropriate Uterine Fundus: firm Incision: n/a DVT Evaluation: No evidence of DVT seen on physical exam. Negative Homan's sign. No cords or calf tenderness.  Discharge Diagnoses: Term Pregnancy-delivered  Discharge Information: Date: 04/16/2014 Activity: pelvic rest Diet: routine Medications: Ibuprofen Condition: stable Instructions: Contact physician for any worsening pain, bleeding, fever, nausea Discharge to: home Follow-up Information   Follow up with Tilda BurrowFERGUSON,JOHN V, MD In 4 weeks.   Specialties:  Obstetrics and Gynecology, Radiology   Contact information:   [address] [place] [address] [phone]       Newborn Data: Live born female  Birth Weight: 8 lb 3.8 oz (3737 g) APGAR: 8, 9  Home with mother.  Patient evaluated with Francene FindersKimberly Cheng Dec  Stephens, Devin A 04/17/2014 7:36AM  I have seen and examined this patient and I agree with the above. Cam HaiSHAW, Braedon Sjogren CNM 7:53 AM 04/17/2014

## 2014-04-16 NOTE — Lactation Note (Addendum)
This note was copied from the chart of Girl Xanthe Wishon. Lactation Consultation Note  Patient Name: Girl Art BuffMaura Spalla GEXBM'WToday's Date: 04/16/2014 Reason for consult: Initial assessment Baby 18 hours of life. Mom reports baby not nursing well because baby is sleepy. Assisted mom to latch baby to right breast in football hold. Undressed baby and baby attempted to latch briefly. Baby falls asleep soon after being awakened several times. Enc mom to offer lots of STS, feed with cues and at least 8-12 times/24 hours. Mom able to return-demonstrate hand expression with colostrum noted from both breast. Assisted mom to feed drops of colostrum to baby. Enc mom to continue attempting to nurse often and feed baby drops of colostrum as able. [facility] given [facility] broch[facility], aware of OP/BFSG and community resources.  Maternal Data Has patient been taught Hand Expression?: Yes Does the patient have breastfeeding experience prior to this delivery?: No  Feeding Feeding Type: Breast Fed Length of feed: 3 min  LATCH Score/Interventions Latch: Repeated attempts needed to sustain latch, nipple held in mouth throughout feeding, stimulation needed to elicit sucking reflex. Intervention(s): Adjust position;Assist with latch;Breast compression  Audible Swallowing: None Intervention(s): Skin to skin  Type of Nipple: Everted at rest and after stimulation  Comfort (Breast/Nipple): Soft / non-tender     Hold (Positioning): Assistance needed to correctly position infant at breast and maintain latch. Intervention(s): Breastfeeding basics reviewed;Support Pillows;Position options  LATCH Score: 6  Lactation Tools Discussed/Used     Consult Status Consult Status: Follow-up Date: 04/17/14 Follow-up type: In-patient    Geralynn OchsWILLIARD, Hertha Gergen 04/16/2014, 2:05 PM

## 2014-04-16 NOTE — MAU Provider Note (Signed)
Attestation of Attending Supervision of Fellow: Evaluation and management procedures were performed by the Fellow under my supervision and collaboration.  I have reviewed the Fellow's note and chart, and I agree with the management and plan.    

## 2014-04-16 NOTE — Progress Notes (Signed)
UR completed 

## 2014-04-16 NOTE — Progress Notes (Signed)
Clinical Social Work Department PSYCHOSOCIAL ASSESSMENT - MATERNAL/CHILD 04/16/2014  Patient:  [name],[name]  Account Number:  [id number]  Admit Date:  04/15/2014  Childs Name:   [name]    Clinical Social Worker:  Herrick Hartog, CLINICAL SOCIAL WORKER   Date/Time:  04/16/2014 10:15 AM  Date Referred:  04/15/2014   Referral source  Central Nursery     Referred reason  Substance Abuse   Other referral source:    I:  FAMILY / HOME ENVIRONMENT Child'Hickman legal guardian:  PARENT  Guardian - Name Guardian - Age Guardian - Address  Jeremiah S[address] 18 [address] [place], [place] [address]  Caleb Hickman  Same as above   Other household support members/support persons Name Relationship DOB   GRAND MOTHER    Other support:   MOB identified numerous family members and friends who are supportive.  MGM present in present room throughout assessment, and was identified as primary support.    II  PSYCHOSOCIAL DATA Information Source:  Family Interview  Financial and Community Resources Employment:   MOB and FOB are both unemployed.   Financial resources:  Medicaid If Medicaid - County:  [place] Other  Food Stamps  WIC   School / Grade:   Maternity Care Coordinator / Child Services Coordination / Early Interventions:  Cultural issues impacting care:   None reported    III  STRENGTHS Strengths  Home prepared for Child (including basic supplies)  Supportive family/friends   Strength comment:    IV  RISK FACTORS AND CURRENT PROBLEMS Current Problem:  YES   Risk Factor & Current Problem Patient Issue Family Issue Risk Factor / Current Problem Comment  Substance Abuse Y N MOB positive for THC throughout pregnancy, was positive on day of admission. Positive for Benzodiazepine during prenatal care         V  SOCIAL WORK ASSESSMENT CSW met with MOB in her room in order to complete the assessment.  FOB and MGM present, but MOB provided consent to complete assessment in their  presence.  Family willingly participated in the assessment, MOB expressed normative questions/concerns if CPS becomes involved. MOB observed to be slightly tearful as CSW concluded, MGM expressed concern related to CPS involvement and wanted to emphasize that the family is supportive.   MOB expressed normative fear when she first learned that she was pregnant, but shared that she has become excited and is eager to return home.  MOB and FOB live with PGM, and PGM is assisting in preparing the home for the baby.  MOB and FOB identified strong family support, who are able to assist as needed.  MOB denied prior mental health history, but receptive to exploring and discussing post-partum depression.  MOB expressed willingness to reach out to MD if needed.    MOB acknowledged reason for CSW consult, and does admit to THC use during pregnancy. MOB'Hickman reports minimized her THC use, but she did express concern when she learned of potential CPS involvement. Per MOB, she started to smoke THC 2-3x/week since she was nauseous and felt that THC use was the only intervention that would assist her to eat.  MOB acknowledged that MD has prescribed her medication, but sounded hesitant when CSW inquired about the efficacy of the medication versus THC.  MOB aware that she was positive for THC upon admission to [facility], but did not express any concern until CSW discussed drug screen policy.  MOB inquired about "what next" if urine and meconium are positive for   THC. CSW discussed CPS involvement, MOB presented as fearful as she became tearful.  CSW reassured MOB that CPS attempts to ensure safety and security, and removal of children is a last resort.  She continued to be tearful, and both she and FOB expressed that they are able to care for the baby without concern.  MOB denied desire to continue THC as she stated that she is not "addicted", and shared that she only smoked so that she could eat during her pregnancy.  MOB  declined offer for substance abuse referral, again emphasizing "I can stop".  CSW highlighted perceptions that she was verbalizing "I can stop" versus "I will stop", and she continued to maintain stance that she will not use substances upon discharge.    MOB and FOB requested that CSW follow-up with family to notify them of outcome of CPS involvement.  CSW shared that CSW is awaiting urine screen, and discussed that meconium result will not be available for a few days. Family verbalized understanding, and agreeable to reaching out to CSW if needed.  VI SOCIAL WORK PLAN Social Work Plan  Information/Referral to Community Resources  Patient/Family Education  No Further Intervention Required / No Barriers to Discharge   Type of pt/family education:   Post partum depresison and anxiety  Drug screen policy  CPS report will be made if meconium is positive for substances   If child protective services report - county:   If child protective services report - date:   Information/referral to community resources comment:   CSW discussed availability of substance abuse resouces in area, MOB declined   Other social work plan:   CSW to await baby drug screens results, will make CPS report if toxicology screens are positive.     

## 2014-04-16 NOTE — Progress Notes (Signed)
18 year old G1P1 s/p vaginal delivery at 4632w0d gestation Post Partum Day 1 Subjective: no complaints, up ad lib, voiding and tolerating PO  Objective: Blood pressure 95/75, pulse 67, temperature 97.7 F (36.5 C), temperature source Oral, resp. rate 16, height 5\' 1"  (1.549 m), weight 66.225 kg (146 lb), last menstrual period 07/03/2013, SpO2 87.00%.  Physical Exam:  General: alert, cooperative and no distress Lochia: appropriate Uterine Fundus: firm Incision: n/a DVT Evaluation: No evidence of DVT seen on physical exam. Negative Homan's sign. No cords or calf tenderness.   Recent Labs  04/15/14 0305 04/16/14 0621  HGB 13.1 10.4*  HCT 38.2 31.4*    Assessment/Plan: Plan for discharge tomorrow Contraception undecided.  Patient seen with Fredirick LatheKristy Colen Eltzroth   LOS: 1 day   Teodora MediciStephens, Devin A 04/16/2014, 7:48 AM   I have seen and examined this patient and agree with above documentation in the resident's note.  - would like mirena  Fredirick LatheKristy Eevee Borbon, MD OB Fellow 04/16/2014 10:26 AM

## 2014-04-17 MED ORDER — IBUPROFEN 600 MG PO TABS: 600.0000 mg | ORAL_TABLET | Freq: Four times a day (QID) | ORAL | Status: DC

## 2014-04-17 NOTE — Lactation Note (Addendum)
This note was copied from the chart of Girl Elisabella Schrader. Lactation Consultation Note  Patient Name: Girl Art BuffMaura Briguglio UJWJX'BToday's Date: 04/17/2014 Reason for consult: Follow-up assessment Baby 39 hours of life. Mom reports soreness on right nipple. Mom given comfort gels with instructions. Mom reports baby just finished nursing. Baby still cueing to nurse. Assisted mom to latch baby to right breast. Mom able to hand express colostrum. Baby latches deeply, suckles rhythmically with intermittent swallows. Enc mom to feed with cues. Discussed cluster feeding. Enc mom to nurse often. Discussed engorgement prevention/treatment. Mom aware of OP/BFSG and community resources. Parents referred to [facility] and [facility] booklet for EBM storage guidelines and number of diapers to expect per day of life. Mom given hand pump with instructions. Patient's MBU RN Melissa Q. Reports mom has not been latching baby to left side. Mom states she and FOB were able to latch baby to left breast [facility]. Enc mom to alternate breast she starts with each time she nurses to have a good supply. [facility]. [facility] enc [facility] with any questions or concerns.  Maternal Data    Feeding Feeding Type: Breast Fed Length of feed: 15 min  LATCH Score/Interventions Latch: Grasps breast easily, tongue down, lips flanged, rhythmical sucking. Intervention(s): Assist with latch;Adjust position;Breast compression  Audible Swallowing: A few with stimulation Intervention(s): Skin to skin;Hand expression  Type of Nipple: Everted at rest and after stimulation  Comfort (Breast/Nipple): Soft / non-tender     Hold (Positioning): Assistance needed to correctly position infant at breast and maintain latch. Intervention(s): Breastfeeding basics reviewed;Support Pillows  LATCH Score: 8  Lactation Tools Discussed/Used     Consult Status Consult Status: PRN Follow-up type:  In-patient    Geralynn OchsWILLIARD, Yanilen Adamik 04/17/2014, 10:56 AM

## 2014-04-18 ENCOUNTER — Telehealth: Payer: Self-pay | Admitting: *Deleted

## 2014-04-18 NOTE — Telephone Encounter (Signed)
Pt states vaginal delivered 04/15/2014 vaginally, having vaginal and back pain from epidural ibuprofen not helping. Pt informed continue to take ibuprofen as prescribed no provider in the office now will route message. Pt verbalized understanding.

## 2014-04-21 ENCOUNTER — Encounter: Payer: Medicaid Other | Admitting: Women's Health

## 2014-04-30 ENCOUNTER — Telehealth: Payer: Self-pay | Admitting: Advanced Practice Midwife

## 2014-04-30 ENCOUNTER — Telehealth: Payer: Self-pay | Admitting: *Deleted

## 2014-04-30 NOTE — Telephone Encounter (Signed)
Pt called with complaints of back pain. Describes it as a shooting pain in her back. Had a vaginal delivery on 04/15/14. Pt states it hurts to change her baby's diaper. Pt was requesting pain meds. I advised pt would need to be seen. Pt voiced understanding. Call transferred to front desk. JSY

## 2014-05-01 ENCOUNTER — Ambulatory Visit (INDEPENDENT_AMBULATORY_CARE_PROVIDER_SITE_OTHER): Payer: Medicaid Other | Admitting: Advanced Practice Midwife

## 2014-05-01 ENCOUNTER — Encounter: Payer: Self-pay | Admitting: Advanced Practice Midwife

## 2014-05-01 VITALS — BP 112/76 | Ht 61.0 in | Wt 120.0 lb

## 2014-05-01 DIAGNOSIS — M545 Low back pain, unspecified: Secondary | ICD-10-CM

## 2014-05-01 DIAGNOSIS — Z1389 Encounter for screening for other disorder: Secondary | ICD-10-CM

## 2014-05-01 LAB — POCT URINALYSIS DIPSTICK
Glucose, UA: NEGATIVE
Ketones, UA: NEGATIVE
NITRITE UA: NEGATIVE

## 2014-05-01 MED ORDER — CYCLOBENZAPRINE HCL 10 MG PO TABS: 10.0000 mg | ORAL_TABLET | Freq: Three times a day (TID) | ORAL | Status: DC | PRN

## 2014-05-01 MED ORDER — METHOCARBAMOL 500 MG PO TABS: 500.0000 mg | ORAL_TABLET | Freq: Four times a day (QID) | ORAL | Status: DC

## 2014-05-01 NOTE — Progress Notes (Signed)
[facility] Visit  Patient name: [name] MRN [id number]  Date of birth: 09/14/1995  CC & HPI:  [name] is a 18 y.o. Caucasian female presenting today for C/O lower back pain since her vaginal delivery 04/15/14.  Describes it as sometimes shooting, exacerbated by bending over. Had an epidural, pain is related to this area, but also has pain across lower back.  Also has a "bump" on vulva. Requests pain medicine  Pertinent History Reviewed:  Medical & Surgical Hx:   Past Medical History  Diagnosis Date  . Medical history non-contributory    Past Surgical History  Procedure Laterality Date  . No past surgeries     Medications: Reviewed & Updated - see associated section Social History: Reviewed -  reports that she has been smoking Cigarettes.  She has a 2.5 pack-year smoking history. She has never used smokeless tobacco.  Objective Findings:  Vitals: BP 112/76  Ht 5\' 1"  (1.549 m)  Wt 120 lb (54.432 kg)  BMI 22.69 kg/m2  Breastfeeding? No  Physical Examination: General appearance - alert, well appearing, and in no distress Mental status - alert, oriented to person, place, and time Neck -  no trigger points Back exam - pain with motion noted during exam, tenderness noted at epidural site, normal reflexes and strength bilateral lower extremities, sensory exam intact bilateral lower extremities Pelvic:  Small folliculitis on mons, no abscess Musculoskeletal - LBP exacerbated by movement.  No trigger points along spine  Results for orders placed in visit on 05/01/14 (from the past 24 hour(s))  POCT URINALYSIS DIPSTICK   Collection Time    05/01/14 10:17 AM      Result Value Ref Range   Color, UA       Clarity, UA       Glucose, UA neg     Bilirubin, UA       Ketones, UA neg     Spec Grav, UA       Blood, UA 3+     pH, UA       Protein, UA 1+     Urobilinogen, UA       Nitrite, UA neg     Leukocytes, UA moderate (2+)       Assessment & Plan:  A:    Superficial folliculitis  Musculoskeletal lower back pain P:  Apply heat to bump and may squeeze it to drain it   Ice/massage/muscle relaxers (Robaxin and flexeril prescribed--know not to take both together, robaxin during the day and flexeril at night)  F/U as scheduled for postpartum checkup and IUD (knows not to have intercourse until IUD)   CRESENZO-DISHMAN,Trixie Maclaren CNM 05/01/2014 10:32 AM

## 2014-05-01 NOTE — Progress Notes (Signed)
Patient ID: [name], female   DOB: 03/15/1996, 18 y.o.   MRN: [id number] Pt states that she is having lower back pain, and has had this since delivery but it has gotten worse. Pt rates the pain an 8 or 9 on a scale from 0-10. Pt states that she is also having moderate cramping, like period cramps. Pt states that she has noticed those for about a week and a half. Pt states that she has taken 800mg  ibuprofen and tylenol with no relief. Urine was dipped and shows 3+ blood, 1+protein and moderate WBCs.

## 2014-05-21 ENCOUNTER — Ambulatory Visit: Payer: Medicaid Other | Admitting: Advanced Practice Midwife

## 2014-07-14 ENCOUNTER — Encounter: Payer: Self-pay | Admitting: Advanced Practice Midwife

## 2014-07-21 ENCOUNTER — Emergency Department (HOSPITAL_COMMUNITY)
Admission: EM | Admit: 2014-07-21 | Discharge: 2014-07-21 | Disposition: A | Discharge status: Home / Self Care | Payer: Medicaid Other | Attending: Emergency Medicine | Admitting: Emergency Medicine

## 2014-07-21 ENCOUNTER — Encounter (HOSPITAL_COMMUNITY): Payer: Self-pay | Admitting: *Deleted

## 2014-07-21 DIAGNOSIS — M545 Low back pain, unspecified: Secondary | ICD-10-CM

## 2014-07-21 DIAGNOSIS — Z720 Tobacco use: Secondary | ICD-10-CM | POA: Insufficient documentation

## 2014-07-21 LAB — URINALYSIS, ROUTINE W REFLEX MICROSCOPIC
BILIRUBIN URINE: NEGATIVE
Glucose, UA: NEGATIVE mg/dL
Hgb urine dipstick: NEGATIVE
KETONES UR: NEGATIVE mg/dL
Leukocytes, UA: NEGATIVE
Nitrite: NEGATIVE
PROTEIN: NEGATIVE mg/dL
Specific Gravity, Urine: 1.01 (ref 1.005–1.030)
UROBILINOGEN UA: 0.2 mg/dL (ref 0.0–1.0)
pH: 6.5 (ref 5.0–8.0)

## 2014-07-21 MED ORDER — MELOXICAM 7.5 MG PO TABS: 7.5000 mg | ORAL_TABLET | Freq: Every day | ORAL | Status: DC

## 2014-07-21 MED ORDER — TRAMADOL HCL 50 MG PO TABS: 50.0000 mg | ORAL_TABLET | Freq: Four times a day (QID) | ORAL | Status: DC | PRN

## 2014-07-21 NOTE — ED Notes (Signed)
Low back pain  Since giving birth and having an epidural.  [facility]

## 2014-07-21 NOTE — Discharge Instructions (Signed)

## 2014-07-21 NOTE — ED Provider Notes (Signed)
CSN: [id number]     Arrival date & time 07/21/14  1052 History  This chart was scribed for non-physician practitioner Burgess AmorJulie Thanh Pomerleau, PA-C working with Audree CamelScott T Goldston, MD by Littie Deedsichard Sun, ED Scribe. This patient was seen in room APFT20/APFT20 and the patient's care was started at 11:41 AM.      Chief Complaint  Patient presents with  . Back Pain     The history is provided by the patient.   HPI Comments: [name] is a 18 y.o. female who presents to the Emergency Department complaining of low back pain radiating throughout her whole back that began after an epidural shot and giving birth 3 months ago. [facility] and was given two different muscle relaxer medications, including Flexeril, but they did not provide relief to symptoms. She has also tried Ibuprofen and Percocet for her pain. Patient denies having fever and any urinary symptoms. She denies having back problems before pregnancy. She is not currently breastfeeding. Patient is not on any other medications and has NKDA. She does not currently having a PCP; she is done seeing Dr. Emelda FearFerguson and plans to see Dr. Janna Archondiego for her primary care.   Patient denies any new injury specifically.   There has been no weakness or numbness in the lower extremities and no urinary or bowel retention or incontinence.  Patient does not have a history of cancer or IVDU.    Past Medical History  Diagnosis Date  . Medical history non-contributory    Past Surgical History  Procedure Laterality Date  . No past surgeries     Family History  Problem Relation Age of Onset  . Cancer Maternal Grandmother    History  Substance Use Topics  . Smoking status: Current Every Day Smoker -- 0.50 packs/day for 5 years    Types: Cigarettes  . Smokeless tobacco: Never Used  . Alcohol Use: No   OB History    Gravida Para Term Preterm AB TAB SAB Ectopic Multiple Living   1 1 1       1      Review of Systems  Constitutional:  Negative for fever.  Respiratory: Negative for shortness of breath.   Cardiovascular: Negative for chest pain and leg swelling.  Gastrointestinal: Negative for abdominal pain, constipation and abdominal distention.  Genitourinary: Negative for dysuria, urgency, frequency, flank pain and difficulty urinating.  Musculoskeletal: Positive for back pain. Negative for joint swelling and gait problem.  Skin: Negative for rash.  Neurological: Negative for weakness and numbness.      Allergies  Review of patient's allergies indicates no known allergies.  Home Medications   Prior to Admission medications   Medication Sig Start Date End Date Taking? Authorizing Provider  cyclobenzaprine (FLEXERIL) 10 MG tablet Take 1 tablet (10 mg total) by mouth every 8 (eight) hours as needed for muscle spasms. 05/01/14   Jacklyn ShellFrances Cresenzo-Dishmon, CNM  ibuprofen (ADVIL,MOTRIN) 600 MG tablet Take 1 tablet (600 mg total) by mouth every 6 (six) hours. 04/17/14   Teodora Medicievin A Stephens, MD  meloxicam (MOBIC) 7.5 MG tablet Take 1 tablet (7.5 mg total) by mouth daily. 07/21/14   Burgess AmorJulie Tashianna Broome, PA-C  methocarbamol (ROBAXIN) 500 MG tablet Take 1 tablet (500 mg total) by mouth 4 (four) times daily. 05/01/14   Jacklyn ShellFrances Cresenzo-Dishmon, CNM  Prenat-FeFum-FePo-FA-Omega 3 (CONCEPT DHA) 53.5-38-1 MG CAPS Take 1 tablet by mouth daily. 01/02/14   Lazaro ArmsLuther H Eure, MD  traMADol (ULTRAM) 50 MG tablet Take 1  tablet (50 mg total) by mouth every 6 (six) hours as needed. 07/21/14   Burgess AmorJulie Jaylene Arrowood, PA-C   BP 106/66 mmHg  Pulse 95  Temp(Src) 97.9 F (36.6 C) (Oral)  Resp 18  Ht 5\' 1"  (1.549 m)  Wt 120 lb (54.432 kg)  BMI 22.69 kg/m2  SpO2 100%  LMP 07/18/2014 (Exact Date)  Breastfeeding? No Physical Exam  Constitutional: She appears well-developed and well-nourished.  HENT:  Head: Normocephalic.  Eyes: Conjunctivae are normal.  Neck: Normal range of motion. Neck supple.  Cardiovascular: Normal rate and intact distal pulses.   Pedal pulses  normal.  Pulmonary/Chest: Effort normal.  Abdominal: Soft. Bowel sounds are normal. She exhibits no distension and no mass.  Musculoskeletal: Normal range of motion. She exhibits no edema.       Lumbar back: She exhibits tenderness. She exhibits no swelling, no edema and no spasm.  TTP along right para-lumbar soft tissue. No edema or erythema.  Neurological: She is alert. She has normal strength. She displays no atrophy and no tremor. No sensory deficit. Gait normal.  Reflex Scores:      Patellar reflexes are 2+ on the right side and 2+ on the left side.      Achilles reflexes are 2+ on the right side and 2+ on the left side. No strength deficit noted in hip and knee flexor and extensor muscle groups.  Ankle flexion and extension intact.  Skin: Skin is warm and dry.  Psychiatric: She has a normal mood and affect.  Nursing note and vitals reviewed.   ED Course  Procedures  DIAGNOSTIC STUDIES: Oxygen Saturation is 100% on room air, normal by my interpretation.    COORDINATION OF CARE: 11:48 AM-Discussed treatment plan which includes medications with pt at bedside and pt agreed to plan.    Labs Review Labs Reviewed  URINALYSIS, ROUTINE W REFLEX MICROSCOPIC    Imaging Review No results found.   EKG Interpretation None      MDM   Final diagnoses:  Right-sided low back pain without sciatica    Patients labs and/or radiological studies were viewed and considered during the medical decision making and disposition process. No neuro deficit on exam or by history to suggest emergent or surgical presentation.  Also discussed worsened sx that should prompt immediate re-evaluation including distal weakness, bowel/bladder retention/incontinence.  No exam findings suggesting infection.  She was prescribed mobic and tramadol.  Encouraged heat tx, f/u with pcp for recheck if not improving over one week.      I personally performed the services described in this documentation, which  was scribed in my presence. The recorded information has been reviewed and is accurate.   Burgess AmorJulie Maresha Anastos, PA-C 07/23/14 1826  Audree CamelScott T Goldston, MD 07/25/14 [phone]

## 2014-07-21 NOTE — ED Notes (Signed)
PA at bedside.

## 2014-08-20 ENCOUNTER — Ambulatory Visit: Payer: Medicaid Other | Admitting: Women's Health

## 2014-11-26 ENCOUNTER — Emergency Department: Payer: Self-pay | Admitting: Emergency Medicine

## 2014-12-29 ENCOUNTER — Encounter (HOSPITAL_COMMUNITY): Payer: Self-pay | Admitting: *Deleted

## 2014-12-29 ENCOUNTER — Inpatient Hospital Stay (HOSPITAL_COMMUNITY)
Admission: AD | Admit: 2014-12-29 | Discharge: 2015-01-01 | DRG: 885 | Disposition: A | Discharge status: Home / Self Care | Payer: MEDICAID | Source: Intra-hospital | Attending: Psychiatry | Admitting: Psychiatry

## 2014-12-29 ENCOUNTER — Emergency Department (HOSPITAL_COMMUNITY)
Admission: EM | Admit: 2014-12-29 | Discharge: 2014-12-29 | Disposition: A | Discharge status: Other Acute Inpatient Hospital | Payer: Medicaid Other | Attending: Emergency Medicine | Admitting: Emergency Medicine

## 2014-12-29 DIAGNOSIS — Y9289 Other specified places as the place of occurrence of the external cause: Secondary | ICD-10-CM | POA: Diagnosis not present

## 2014-12-29 DIAGNOSIS — T50902A Poisoning by unspecified drugs, medicaments and biological substances, intentional self-harm, initial encounter: Secondary | ICD-10-CM

## 2014-12-29 DIAGNOSIS — T502X2A Poisoning by carbonic-anhydrase inhibitors, benzothiadiazides and other diuretics, intentional self-harm, initial encounter: Secondary | ICD-10-CM | POA: Diagnosis present

## 2014-12-29 DIAGNOSIS — F17210 Nicotine dependence, cigarettes, uncomplicated: Secondary | ICD-10-CM | POA: Diagnosis present

## 2014-12-29 DIAGNOSIS — T6592XA Toxic effect of unspecified substance, intentional self-harm, initial encounter: Secondary | ICD-10-CM | POA: Insufficient documentation

## 2014-12-29 DIAGNOSIS — Y9389 Activity, other specified: Secondary | ICD-10-CM | POA: Diagnosis not present

## 2014-12-29 DIAGNOSIS — F419 Anxiety disorder, unspecified: Secondary | ICD-10-CM | POA: Diagnosis present

## 2014-12-29 DIAGNOSIS — F332 Major depressive disorder, recurrent severe without psychotic features: Principal | ICD-10-CM | POA: Diagnosis present

## 2014-12-29 DIAGNOSIS — G4700 Insomnia, unspecified: Secondary | ICD-10-CM | POA: Diagnosis present

## 2014-12-29 DIAGNOSIS — Z720 Tobacco use: Secondary | ICD-10-CM | POA: Insufficient documentation

## 2014-12-29 DIAGNOSIS — Y998 Other external cause status: Secondary | ICD-10-CM | POA: Diagnosis not present

## 2014-12-29 DIAGNOSIS — Y929 Unspecified place or not applicable: Secondary | ICD-10-CM | POA: Diagnosis not present

## 2014-12-29 LAB — BASIC METABOLIC PANEL
ANION GAP: 6 (ref 5–15)
BUN: 17 mg/dL (ref 6–23)
CHLORIDE: 106 mmol/L (ref 96–112)
CO2: 26 mmol/L (ref 19–32)
Calcium: 8.6 mg/dL (ref 8.4–10.5)
Creatinine, Ser: 0.75 mg/dL (ref 0.50–1.10)
GFR calc non Af Amer: >90 mL/min (ref >90)
Glucose, Bld: 94 mg/dL (ref 70–99)
POTASSIUM: 3.9 mmol/L (ref 3.5–5.1)
Sodium: 138 mmol/L (ref 135–145)

## 2014-12-29 LAB — CBC WITH DIFFERENTIAL/PLATELET
BASOS ABS: 0 10*3/uL (ref 0.0–0.1)
Basophils Relative: 0 % (ref 0–1)
EOS PCT: 1 % (ref 0–5)
Eosinophils Absolute: 0.1 10*3/uL (ref 0.0–0.7)
HEMATOCRIT: 41.1 % (ref 36.0–46.0)
Hemoglobin: 13.7 g/dL (ref 12.0–15.0)
LYMPHS ABS: 3.6 10*3/uL (ref 0.7–4.0)
LYMPHS PCT: 27 % (ref 12–46)
MCH: 32.2 pg (ref 26.0–34.0)
MCHC: 33.3 g/dL (ref 30.0–36.0)
MCV: 96.7 fL (ref 78.0–100.0)
MONOS PCT: 9 % (ref 3–12)
Monocytes Absolute: 1.2 10*3/uL — ABNORMAL HIGH (ref 0.1–1.0)
Neutro Abs: 8.5 10*3/uL — ABNORMAL HIGH (ref 1.7–7.7)
Neutrophils Relative %: 63 % (ref 43–77)
Platelets: 303 10*3/uL (ref 150–400)
RBC: 4.25 MIL/uL (ref 3.87–5.11)
RDW: 13.6 % (ref 11.5–15.5)
WBC: 13.4 10*3/uL — ABNORMAL HIGH (ref 4.0–10.5)

## 2014-12-29 LAB — COMPREHENSIVE METABOLIC PANEL
ALBUMIN: 4.1 g/dL (ref 3.5–5.2)
ALT: 13 U/L (ref 0–35)
AST: 15 U/L (ref 0–37)
Alkaline Phosphatase: 68 U/L (ref 39–117)
Anion gap: 7 (ref 5–15)
BUN: 17 mg/dL (ref 6–23)
CALCIUM: 9.1 mg/dL (ref 8.4–10.5)
CO2: 30 mmol/L (ref 19–32)
CREATININE: 0.82 mg/dL (ref 0.50–1.10)
Chloride: 104 mmol/L (ref 96–112)
GFR calc Af Amer: >90 mL/min (ref >90)
GFR calc non Af Amer: >90 mL/min (ref >90)
Glucose, Bld: 41 mg/dL — CL (ref 70–99)
Potassium: 3.8 mmol/L (ref 3.5–5.1)
Sodium: 141 mmol/L (ref 135–145)
Total Bilirubin: 0.4 mg/dL (ref 0.3–1.2)
Total Protein: 7 g/dL (ref 6.0–8.3)

## 2014-12-29 LAB — CBG MONITORING, ED
GLUCOSE-CAPILLARY: 71 mg/dL (ref 70–99)
Glucose-Capillary: 96 mg/dL (ref 70–99)

## 2014-12-29 LAB — RAPID URINE DRUG SCREEN, HOSP PERFORMED
Amphetamines: NOT DETECTED
BARBITURATES: NOT DETECTED
Benzodiazepines: POSITIVE — AB
COCAINE: NOT DETECTED
Opiates: POSITIVE — AB
TETRAHYDROCANNABINOL: NOT DETECTED

## 2014-12-29 LAB — SALICYLATE LEVEL

## 2014-12-29 LAB — ACETAMINOPHEN LEVEL
Acetaminophen (Tylenol), Serum: <10 ug/mL — ABNORMAL LOW (ref 10–30)
Acetaminophen (Tylenol), Serum: <10 ug/mL — ABNORMAL LOW (ref 10–30)

## 2014-12-29 LAB — ETHANOL: Alcohol, Ethyl (B): <5 mg/dL (ref 0–9)

## 2014-12-29 LAB — POC URINE PREG, ED: Preg Test, Ur: NEGATIVE

## 2014-12-29 MED ORDER — TRAZODONE HCL 50 MG PO TABS
50.0000 mg | ORAL_TABLET | Freq: Every evening | ORAL | Status: DC | PRN
  Administered 2014-12-30 – 2014-12-31 (×2): 50 mg via ORAL
  Filled 2014-12-29: qty 28
  Filled 2014-12-29 (×3): qty 1
  Filled 2014-12-29: qty 28
  Filled 2014-12-29 (×6): qty 1

## 2014-12-29 MED ORDER — ONDANSETRON 8 MG PO TBDP: 8.0000 mg | ORAL_TABLET | Freq: Three times a day (TID) | ORAL | Status: DC | PRN

## 2014-12-29 MED ORDER — IBUPROFEN 400 MG PO TABS: 400.0000 mg | ORAL_TABLET | Freq: Four times a day (QID) | ORAL | Status: DC | PRN

## 2014-12-29 MED ORDER — HYDROXYZINE HCL 25 MG PO TABS
25.0000 mg | ORAL_TABLET | Freq: Four times a day (QID) | ORAL | Status: DC | PRN
  Administered 2014-12-31 (×2): 25 mg via ORAL
  Filled 2014-12-29: qty 30
  Filled 2014-12-29 (×2): qty 1

## 2014-12-29 MED ORDER — NICOTINE 21 MG/24HR TD PT24
21.0000 mg | MEDICATED_PATCH | Freq: Every day | TRANSDERMAL | Status: DC
  Administered 2014-12-30 – 2014-12-31 (×2): 21 mg via TRANSDERMAL
  Filled 2014-12-29 (×7): qty 1

## 2014-12-29 MED ORDER — SODIUM CHLORIDE 0.9 % IV BOLUS (SEPSIS)
1000.0000 mL | Freq: Once | INTRAVENOUS | Status: AC
  Administered 2014-12-29: 1000 mL via INTRAVENOUS

## 2014-12-29 MED ORDER — MAGNESIUM HYDROXIDE 400 MG/5ML PO SUSP: 30.0000 mL | Freq: Every day | ORAL | Status: DC | PRN

## 2014-12-29 MED ORDER — ENSURE ENLIVE PO LIQD: 237.0000 mL | Freq: Two times a day (BID) | ORAL | Status: DC

## 2014-12-29 MED ORDER — IBUPROFEN 600 MG PO TABS
600.0000 mg | ORAL_TABLET | Freq: Four times a day (QID) | ORAL | Status: DC
  Filled 2014-12-29 (×2): qty 1

## 2014-12-29 MED ORDER — ALUM & MAG HYDROXIDE-SIMETH 200-200-20 MG/5ML PO SUSP
30.0000 mL | ORAL | Status: DC | PRN
Start: 2014-12-29 — End: 2015-01-01

## 2014-12-29 MED ORDER — NICOTINE 7 MG/24HR TD PT24
7.0000 mg | MEDICATED_PATCH | Freq: Every day | TRANSDERMAL | Status: DC
  Administered 2014-12-29: 7 mg via TRANSDERMAL
  Filled 2014-12-29 (×2): qty 1

## 2014-12-29 MED ORDER — IBUPROFEN 600 MG PO TABS: 600.0000 mg | ORAL_TABLET | Freq: Four times a day (QID) | ORAL | Status: DC | PRN

## 2014-12-29 MED ORDER — ACETAMINOPHEN 325 MG PO TABS: 650.0000 mg | ORAL_TABLET | Freq: Four times a day (QID) | ORAL | Status: DC | PRN

## 2014-12-29 MED ORDER — LORAZEPAM 1 MG PO TABS: 1.0000 mg | ORAL_TABLET | Freq: Four times a day (QID) | ORAL | Status: DC | PRN

## 2014-12-29 NOTE — ED Provider Notes (Signed)
[facility]  Unable to clear for outpatient treatment 2/2 acute overdose, poor insight.  Medically cleared. Await placement.  IVC completed.  Rolland PorterMark Ashanna Heinsohn, MD 12/29/14 [phone]

## 2014-12-29 NOTE — ED Notes (Signed)
[facility]wr[facility]

## 2014-12-29 NOTE — BH Assessment (Addendum)
Tele Assessment Note   Lucendia HerrlichMaura S Manygoats is an 19 y.o. female, single, Caucasian who presents unaccompanied to [facility] via EMS after intentionally overdosing on approximately 25 tabs of HCTZ and 2 tabs of Xanax at 0200. Pt states she had a conflict with her boyfriend tonight because she cheated on him and he threatened to break up with her. Pt and boyfriend have an 4462-month-old daughter and Pt lives with her boyfriend and his grandparents. Pt states this overdose was not a suicide attempt and she took the pills "for attention and to scare my boyfriend." She denies current suicidal ideation or any history of suicide attempts. She denies any history of intentional self-injurious behavior. She denies any homicidal ideation or history of aggressive behavior. Pt denies access to firearms or other weapons. She denies any history of psychotic symptoms. 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 She states she relapsed on Percocet two days ago and also took two Xanax.  Pt denies depressive symptoms. She reports decreased sleep due to having a baby. She denies any specific stressors other than the conflict with her boyfriend. She states she was prescribed Abilify at age 19 for "mood swings" but denies that is a problem now. She denies any history of inpatient mental health treatment. Pt identified her mother and her sister as supportive. She denies any family history of mental health or substance abuse problems. She denies any history of abuse or trauma.  Pt is dressed in hospital gown, drowsy, oriented x4 with normal speech and normal motor behavior. Eye contact is fair. Pt's mood is somewhat guilty and affect is congruent with mood. Thought process is coherent and relevant. There is no indication Pt is currently responding to internal stimuli or experiencing delusional thought content. Pt insists she is safe to go home. She says  she has a job interview with [employer] later today. She says she is not willing to sign into a psychiatric hospital even if the psychiatrist recommends inpatient treatment.   Axis I: Unspecified Depressive Disorder Axis II: Deferred Axis III:  Past Medical History  Diagnosis Date  . Medical history non-contributory    Axis IV: occupational problems and other psychosocial or environmental problems Axis V: GAF=35  Past Medical History:  Past Medical History  Diagnosis Date  . Medical history non-contributory     Past Surgical History  Procedure Laterality Date  . No past surgeries      Family History:  Family History  Problem Relation Age of Onset  . Cancer Maternal Grandmother     Social History:  reports that she has been smoking Cigarettes.  She has a 2.5 pack-year smoking history. She has never used smokeless tobacco. She reports that she does not drink alcohol or use illicit drugs.  Additional Social History:  Alcohol / Drug Use Pain Medications: Pt reports history of abusing narcotic pain medications Prescriptions: Pt denies abuse Over the Counter: Pt denies abuse History of alcohol / drug use?: Yes (Pt reports a history of abusing various pain medications and taking other people's Xanax) Longest period of sobriety (when/how long): One month  CIWA: CIWA-Ar BP: 98/68 mmHg Pulse Rate: 97 COWS:    PATIENT STRENGTHS: (choose at least two) Ability for insight Average or above average intelligence Capable of independent living Communication skills General fund of knowledge Physical Health Supportive family/friends  Allergies: No Known Allergies  Home Medications:  (  Not in a hospital admission)  OB/GYN Status:  Patient's last menstrual period was 12/17/2014.  General Assessment Data Location of Assessment: [facility] Is this a Tele or Face-to-Face Assessment?: Tele Assessment Is this an Initial Assessment or a Re-assessment for this encounter?: Initial  Assessment Living Arrangements: Other (Comment) (Boyfriend and boyfriend's grandparents) Can pt return to current living arrangement?: Yes Admission Status: Voluntary Is patient capable of signing voluntary admission?: Yes Transfer from: Home Referral Source: Self/Family/Friend     [facility] Crisis Care Plan Living Arrangements: Other (Comment) (Boyfriend and boyfriend's grandparents) Name of Psychiatrist: None Name of Therapist: None  Education Status Is patient currently in school?: No Current Grade: NA Highest grade of school patient has completed: NA Name of school: NA Contact person: NA  Risk to self with the past 6 months Suicidal Ideation: No (Pt denies suicidal ideation but overdosed on 25 HCTZ) Suicidal Intent: No Is patient at risk for suicide?: Yes Suicidal Plan?: Yes-Currently Present Specify Current Suicidal Plan: Pt overdose Access to Means: Yes Specify Access to Suicidal Means: Pt took another person's medication What has been your use of drugs/alcohol within the last 12 months?: Pt has a history of abusing narcotic Previous Attempts/Gestures: No How many times?: 0 Other Self Harm Risks: None identified Triggers for Past Attempts: None known Intentional Self Injurious Behavior: None Family Suicide History: No Recent stressful life event(s): Conflict (Comment), Other (Comment) (Conflict with boyfriend, new baby) Persecutory voices/beliefs?: No Depression: Yes Depression Symptoms: Feeling angry/irritable, Tearfulness, Fatigue Substance abuse history and/or treatment for substance abuse?: Yes Suicide prevention information given to non-admitted patients: Not applicable  Risk to Others within the past 6 months Homicidal Ideation: No Thoughts of Harm to Others: No Current Homicidal Intent: No Current Homicidal Plan: No Access to Homicidal Means: No Identified Victim: None History of harm to others?: No Assessment of Violence: None Noted Violent Behavior  Description: Pt denies history of violence Does patient have access to weapons?: No Criminal Charges Pending?: No Does patient have a court date: No  Psychosis Hallucinations: None noted Delusions: None noted  Mental Status Report Appearance/Hygiene: In hospital gown Eye Contact: Fair Motor Activity: Unremarkable Speech: Logical/coherent Level of Consciousness: Quiet/awake Mood: Guilty Affect: Appropriate to circumstance Anxiety Level: None Thought Processes: Coherent, Relevant Judgement: Partial Orientation: Person, Place, Time, Situation, Appropriate for developmental age Obsessive Compulsive Thoughts/Behaviors: None  Cognitive Functioning Concentration: Normal Memory: Recent Intact, Remote Intact IQ: Average Insight: Fair Impulse Control: Poor Appetite: Good Weight Loss: 0 Weight Gain: 0 Sleep: Decreased Total Hours of Sleep: 5 Vegetative Symptoms: None  ADLScreening [facility] Assessment Services) Patient's cognitive ability adequate to safely complete daily activities?: Yes Patient able to express need for assistance with ADLs?: Yes Independently performs ADLs?: Yes (appropriate for developmental age)  Prior Inpatient Therapy Prior Inpatient Therapy: Yes Prior Therapy Dates: 11/2014 Prior Therapy Facilty/Provider(s): [facility] Reason for Treatment: Substance abuse  Prior Outpatient Therapy Prior Outpatient Therapy: Yes Prior Therapy Dates: Age 80 Prior Therapy Facilty/Provider(s): unknown Reason for Treatment: "mood swings"  ADL Screening (condition at time of admission) Patient's cognitive ability adequate to safely complete daily activities?: Yes Is the patient deaf or have difficulty hearing?: No Does the patient have difficulty seeing, even when wearing glasses/contacts?: No Does the patient have difficulty concentrating, remembering, or making decisions?: No Patient able to express need for assistance with ADLs?: Yes Does the patient have difficulty dressing  or bathing?: No Independently performs ADLs?: Yes (appropriate for developmental age) Does the patient have difficulty walking or climbing stairs?:  No Weakness of Legs: None Weakness of Arms/Hands: None  Home Assistive Devices/Equipment Home Assistive Devices/Equipment: None    Abuse/Neglect Assessment (Assessment to be complete while patient is alone) Physical Abuse: Denies Verbal Abuse: Denies Sexual Abuse: Denies Exploitation of patient/patient's resources: Denies Self-Neglect: Denies     Merchant navy officer (For Healthcare) Does patient have an advance directive?: No Would patient like information on creating an advanced directive?: No - patient declined information    Additional Information 1:1 In Past 12 Months?: No CIRT Risk: No Elopement Risk: No Does patient have medical clearance?: No     Disposition: Binnie Rail, AC at [facility] confirms adult unit is currently at capacity. Obeservation unit will be open later today. Gave clinical report to Hulan Fess, NP who recommends Pt be evaluated by psychiatry later this morning. Spoke to Dr. Zadie Rhine who agrees with recommendation. Notified Corine Shelter, RN of plan.  Disposition Initial Assessment Completed for this Encounter: Yes Disposition of Patient: Other dispositions Other disposition(s): Other (Comment)  Pamalee Leyden, [facility], [facility], [facility] Triage Specialist [phone]   Pamalee Leyden 12/29/2014 4:46 AM

## 2014-12-29 NOTE — Progress Notes (Signed)
Spoke with [facility] RN, in process of having pt IVC'd.  Sent referrals to the following facilities with bed availability: [facility]- [facility]- per [facility]- [facility]- [facility]- per [facility]- per [facility]- per [facility] JuniperSuz[facility]  Also considered [facility] upon availability of beds.  Ilean SkillMeghan Mariana Wiederholt, MSW, LCSWA Clinical Social Work, Disposition  12/29/2014 [phone]

## 2014-12-29 NOTE — ED Notes (Signed)
Pt brought in by [facility] for c/o overdose; pt and her fiance got into an argument and pt took approximately (25) 25mg  HCTZ; pt states she was upset and that is why she took them

## 2014-12-29 NOTE — ED Notes (Signed)
Pt allowed to use the phone. 

## 2014-12-29 NOTE — ED Notes (Signed)
Pt making phone call. Pt made aware of visitation policy.

## 2014-12-29 NOTE — ED Notes (Signed)
CRITICAL VALUE ALERT  Critical value received:  Glucose 41  Date of notification:  12/29/14  Time of notification:  0403  Critical value read back:Yes.    Nurse who received alert:  Thornton Daleson L Adrianna Dudas, RN  MD notified (1st page):  Wickline  Time of first page:  0404  MD notified (2nd page):  Time of second page:  Responding MD:  wickline   Time MD responded:  [phone]

## 2014-12-29 NOTE — Tx Team (Signed)
Initial Interdisciplinary Treatment Plan   PATIENT STRESSORS: Financial difficulties Marital or family conflict Substance abuse   PATIENT STRENGTHS: Average or above average intelligence Communication skills General fund of knowledge Physical Health Supportive family/friends   PROBLEM LIST: Problem List/Patient Goals Date to be addressed Date deferred Reason deferred Estimated date of resolution  "Get me the hell out of here" 12/29/14     "learn how to control myself' 12/29/14           Increased risk for suicide 12/29/14                                    DISCHARGE CRITERIA:  Ability to meet basic life and health needs Adequate post-discharge living arrangements Improved stabilization in mood, thinking, and/or behavior Medical problems require only outpatient monitoring Motivation to continue treatment in a less acute level of care Need for constant or close observation no longer present Reduction of life-threatening or endangering symptoms to within safe limits Safe-care adequate arrangements made Verbal commitment to aftercare and medication compliance  PRELIMINARY DISCHARGE PLAN: Attend 12-step recovery group Outpatient therapy Return to previous living arrangement  PATIENT/FAMIILY INVOLVEMENT: This treatment plan has been presented to and reviewed with the patient, [name], and/or family member.  The patient and family have been given the opportunity to ask questions and make suggestions.  Fransico MichaelBrooks, Bailey [facility]ella [facility] 12/29/2014, 8:19 PM

## 2014-12-29 NOTE — ED Notes (Signed)
Per Ala DachFord, pt to be reevaluated by psychiatrist in the morning.

## 2014-12-29 NOTE — BH Assessment (Signed)
Received [facility] consult request. Spoke to Zadie Rhineonald Wickline, MD who said Pt had argument with boyfriend and overdosed on at least 25 tabs of HCTZ. Tele-assessment will be initiated.  Harlin RainFord Ellis Patsy BaltimoreWarrick Jr, LPC, [facility], [facility] Triage Specialist [phone]

## 2014-12-29 NOTE — Progress Notes (Addendum)
[facility] by Dr. Jama Flavorsobos to [facility]  Pt's nurse informed.  Melbourne Abtsatia Mathews Stuhr, LCSWA Disposition staff 12/29/2014 3:59 PM

## 2014-12-29 NOTE — ED Notes (Addendum)
RN spoke with pt about inpatient admission. Pt states she doesn't want to go anywhere and wants to go home. RN explained to patient that she took a lethal dose of medication in suicide attempt and can not be psychiatrically cleared at this time. Pt remains reluctant, not making eye contact and rolling eyes. Pt upset that she can not go out and smoke. Refused nicotine patch. EDP completingIVC ppwrk at this time.

## 2014-12-29 NOTE — ED Notes (Signed)
Pt given a drink w/ crackers & peanut butter.

## 2014-12-29 NOTE — ED Provider Notes (Signed)
CSN: [id number]     Arrival date & time 12/29/14  0249 History   First MD Initiated Contact with Patient 12/29/14 [phone]     Chief Complaint  Patient presents with  . Drug Overdose    Patient is a 19 y.o. female presenting with Overdose.  Drug Overdose This is a new problem. The current episode started 1 to 2 hours ago. The problem occurs constantly. The problem has not changed since onset.Pertinent negatives include no chest pain, no abdominal pain, no headaches and no shortness of breath. Nothing aggravates the symptoms. Nothing relieves the symptoms.  pt got into argument with boyfriend and she became upset and took at least 25 tablets of HCTZ She also took 2 xanax She denies other drugs or alcohol She has never attempted to harm herself before   Past Medical History  Diagnosis Date  . Medical history non-contributory    Past Surgical History  Procedure Laterality Date  . No past surgeries     Family History  Problem Relation Age of Onset  . Cancer Maternal Grandmother    History  Substance Use Topics  . Smoking status: Current Every Day Smoker -- 0.50 packs/day for 5 years    Types: Cigarettes  . Smokeless tobacco: Never Used  . Alcohol Use: No   OB History    Gravida Para Term Preterm AB TAB SAB Ectopic Multiple Living   1 1 1       1      Review of Systems  Constitutional: Negative for fever.  Respiratory: Negative for shortness of breath.   Cardiovascular: Negative for chest pain.  Gastrointestinal: Negative for vomiting and abdominal pain.  Neurological: Negative for headaches.  Psychiatric/Behavioral: Positive for suicidal ideas.  All other systems reviewed and are negative.     Allergies  Review of patient's allergies indicates no known allergies.  Home Medications   Prior to Admission medications   Medication Sig Start Date End Date Taking? Authorizing Provider  ibuprofen (ADVIL,MOTRIN) 600 MG tablet Take 1 tablet (600 mg total) by mouth every 6  (six) hours. 04/17/14   Markus Jarvisevin Stephens, MD   BP 117/77 mmHg  Pulse 96  Temp(Src) 97.5 F (36.4 C) (Oral)  Resp 18  Ht 5\' 1"  (1.549 m)  Wt 101 lb (45.813 kg)  BMI 19.09 kg/m2  SpO2 100%  LMP 12/17/2014 Physical Exam CONSTITUTIONAL: Well developed/well nourished HEAD: Normocephalic/atraumatic EYES: EOMI/PERRL ENMT: Mucous membranes moist NECK: supple no meningeal signs SPINE/BACK:entire spine nontender CV: S1/S2 noted, no murmurs/rubs/gallops noted LUNGS: Lungs are clear to auscultation bilaterally, no apparent distress ABDOMEN: soft, nontender, no rebound or guarding, bowel sounds noted throughout abdomen NEURO: Pt is awake/alert/appropriate, moves all extremitiesx4.  No facial droop.   EXTREMITIES: pulses normal/equal, full ROM SKIN: warm, color normal PSYCH: no abnormalities of mood noted, alert and oriented to situation  ED Course  Procedures   4:12 AM Labs reported hypoglycemia.  Pt taking PO, no distress Poison center requested repeat APAP level at 6am, no other recommendations 5:11 AM Pt had telepsych Request monitor in ED and psychiatrist assessment later this morning 7:09 AM Repeat APAP level negative Glucose has stabilized Will await final psych dispo Pt is currently voluntary Labs Review Labs Reviewed  COMPREHENSIVE METABOLIC PANEL - Abnormal; Notable for the following:    Glucose, Bld 41 (*)    All other components within normal limits  CBC WITH DIFFERENTIAL/PLATELET - Abnormal; Notable for the following:    WBC 13.4 (*)    Neutro Abs 8.5 (*)  Monocytes Absolute 1.2 (*)    All other components within normal limits  ACETAMINOPHEN LEVEL - Abnormal; Notable for the following:    Acetaminophen (Tylenol), Serum <10.0 (*)    All other components within normal limits  URINE RAPID DRUG SCREEN (HOSP PERFORMED) - Abnormal; Notable for the following:    Opiates POSITIVE (*)    Benzodiazepines POSITIVE (*)    All other components within normal limits   ACETAMINOPHEN LEVEL - Abnormal; Notable for the following:    Acetaminophen (Tylenol), Serum <10.0 (*)    All other components within normal limits  SALICYLATE LEVEL  ETHANOL  BASIC METABOLIC PANEL  POC URINE PREG, ED  CBG MONITORING, ED  CBG MONITORING, ED     EKG Interpretation   Date/Time:  Monday December 29 2014 03:01:57 EDT Ventricular Rate:  96 PR Interval:  157 QRS Duration: 95 QT Interval:  338 QTC Calculation: 427 R Axis:   12 Text Interpretation:  Sinus rhythm RSR' in V1 or V2, probably normal  variant Borderline T abnormalities, anterior leads No previous ECGs  available Confirmed by Bebe Shaggy  MD, Nakia Remmers ([id number]) on 12/29/2014 3:39:42  AM      MDM   Final diagnoses:  Overdose, intentional self-harm, initial encounter    Nursing notes including past medical history and social history reviewed and considered in documentation Labs/vital reviewed myself and considered during evaluation     Zadie Rhine, MD 12/29/14 0710

## 2014-12-29 NOTE — Progress Notes (Signed)
Pt meets inpatient criteria for psychiatric hospitalization for safety and stabilization. Notes from night shift inadvertently omitted this sentence. [facility] TTS to seek placement.    Beau FannyWITHROW, Irwin Toran C, FNP-BC 12/29/2014       0855 AM

## 2014-12-29 NOTE — ED Notes (Addendum)
Pt states she took 25 to 30,  25 mg HCTZ tablets and 2 xanax after having an argument tonight around 0200. Pt states took the tablets because she was upset. Pt states she has never has SI thoughts in the past & does not know why she did tonight.

## 2014-12-30 ENCOUNTER — Encounter (HOSPITAL_COMMUNITY): Payer: Self-pay | Admitting: Psychiatry

## 2014-12-30 DIAGNOSIS — Y929 Unspecified place or not applicable: Secondary | ICD-10-CM

## 2014-12-30 DIAGNOSIS — T50902A Poisoning by unspecified drugs, medicaments and biological substances, intentional self-harm, initial encounter: Secondary | ICD-10-CM

## 2014-12-30 DIAGNOSIS — T502X2A Poisoning by carbonic-anhydrase inhibitors, benzothiadiazides and other diuretics, intentional self-harm, initial encounter: Secondary | ICD-10-CM

## 2014-12-30 LAB — TSH: TSH: 0.926 u[IU]/mL (ref 0.350–4.500)

## 2014-12-30 MED ORDER — BOOST / RESOURCE BREEZE PO LIQD
1.0000 | Freq: Two times a day (BID) | ORAL | Status: DC
  Administered 2014-12-30 – 2014-12-31 (×2): 1 via ORAL
  Filled 2014-12-30 (×8): qty 1

## 2014-12-30 NOTE — Progress Notes (Signed)
D: Patient is alert and oriented. Pt's mood and affect is pleasant upon interaction and depressed. Pt denies SI/HI and AVH. Pt forwards little with RN and engages with RN minimally throughout the day. Pt is tachycardic (See docflowsheet-vitals). Pt is attending unit groups. Pt experiencing hypotension this evening, denies symptoms (See docflwosheet-vitals). BP WDL post fluid and nutrition intake. A: Encouragement/Support provided to pt. Active listening by RN. Will reassess BP and pulse frequently, pt encouraged to increase fluids, pt went to cafeteria for dinner. Scheduled medications administered per providers orders (See MAR). 15 minute checks continued per protocol for patient safety.  R: Patient cooperative and receptive to nursing interventions. Pt remains safe.

## 2014-12-30 NOTE — Tx Team (Signed)
Interdisciplinary Treatment Plan Update   Date Reviewed:  12/30/2014  Time Reviewed:  8:32 AM  Progress in Treatment:   Attending groups: Yes, patient is attending groups. Participating in groups: Yes, engages in group discussion. Taking medication as prescribed: Yes  Tolerating medication: Yes Family/Significant other contact made:  No, but will ask patient for consent for collateral contact Patient understands diagnosis: Yes, patient understands diagnosis and need for treatment. Discussing patient identified problems/goals with staff: Yes, patient is able to express goals for treatment and discharge. Medical problems stabilized or resolved: Yes Denies suicidal/homicidal ideation: Yes Patient has not harmed self or others: Yes  For review of initial/current patient goals, please see plan of care.  Estimated Length of Stay:  3-4 days  Reasons for Continued Hospitalization:  Anxiety Depression Medication stabilization   New Problems/Goals identified:    Discharge Plan or Barriers:   Home with outpatient follow up to be determined  Additional Comments:  [name] is an 19 y.o. female, single, Caucasian who [facility]ac[facility] after intentionally overdosing on approximately 25 tabs of H of HCTZ and 2 tabs of Xanax at 0200. Pt states she had a conflict with her boyfriend tonight because she cheated on him and he threatened to break up with her. Pt and boyfriend have an 5910-month-old daughter and Pt lives with her boyfriend and his grandparents. Pt states this overdose was not a suicide attempt and she took the pills "for attention and to scare my boyfriend." She denies current suicidal ideation or any history of suicide attempts. She denies any history of intentional self-injurious behavior. She denies any homicidal ideation or history of aggressive behavior. Pt denies access to firearms or other weapons. She denies any history of psychotic symptoms. She  reports she has a history of abusing narcotic pain medications and says she went through a detox program at [facility] one month ago. She states she relapsed on Percocet two days ago and also took two Xanax.  Pt denies depressive symptoms. She reports decreased sleep due to having a baby. She denies any specific stressors other than the conflict with her boyfriend. She states she was prescribed Abilify at age 19 for "mood swings" but denies that is a problem now. She denies any history of inpatient mental health treatment. Pt identified her mother and her sister as supportive. She denies any family history of mental health or substance abuse problems. She denies any history of abuse or trauma.  Patient and CSW reviewed patient's identified goals and treatment plan.  Patient verbalized understanding and agreed to treatment plan.   Attendees:  Patient:  12/30/2014 8:32 AM   Signature:  Sallyanne HaversF. Cobos, MD 12/30/2014 8:32 AM  Signature: Geoffery LyonsIrving Lugo, MD 12/30/2014 8:32 AM  Signature:  Harold Barbanonecia Byrd, RN 12/30/2014 8:32 AM  Signature:Beverly Terrilee CroakKnight, RN 12/30/2014 8:32 AM  Signature:   12/30/2014 8:32 AM  Signature:  Juline PatchQuylle Rama Mcclintock, LCSW 12/30/2014 8:32 AM  Signature:  Belenda CruiseKristin Drinkard, LCSW-A 12/30/2014 8:32 AM  Signature:  Leisa LenzValerie Enoch, Care Coordinator [facility] 12/30/2014 8:32 AM  Signature: 12/30/2014 8:32 AM  Signature:  12/30/2014  8:32 AM  Signature:   Onnie BoerJennifer Clark, RN [facility] 12/30/2014  8:32 AM  Signature:   12/30/2014  8:32 AM    Scribe for Treatment Team:   Juline PatchQuylle Emre Stock,  12/30/2014 8:32 AM

## 2014-12-30 NOTE — H&P (Addendum)
Psychiatric Admission Assessment Adult  Patient Identification: [name] MRN:  [id number] Date of Evaluation:  12/30/2014 Chief Complaint:  MDD Severe Principal Diagnosis: Status Post Suicide Attempt by Overdose Diagnosis:   Patient Active Problem List   Diagnosis Date Noted  . MDD (major depressive disorder), recurrent episode, severe [F33.2] 12/29/2014  . Vaginal delivery [O80] 04/16/2014  . Illicit drug use [F19.90] 09/03/2013  . Supervision of normal first pregnancy [Z34.00] 09/02/2013   History of Present Illness:: 19 year old single female, lives with boyfriend. States she was " doing OK " until recently when she had a heated argument with her boyfriend, due to infidelity accusations. This led for her to feel intensely emotional, and she impulsively overdosed on medications " that are his grandfather's heart medication". ( As per chart it was HCTZ)  States " I guess I took about 20". She then called 911, and was brought to hospital. This was two days ago.  States that before this argument she had not been feeling depressed or suicidal and reiterates " I felt fine". She also states she feels better now, and no longer has any SI.  Elements:  Recent impulsive suicide attempt in the context of argument with BF. Denies prior depression or symptoms. Associated Signs/Symptoms: Depression Symptoms:   At this time does not endorse symptoms of depression- denies anhedonia, denies sadness, denies changes in appetite and sleep. Denies any prior suicidal thoughts . (Hypo) Manic Symptoms:   Does not endorse  Anxiety Symptoms:  Denies panic or severe anxiety symptoms Psychotic Symptoms:   Denies hallucinations PTSD Symptoms: Denies PTSD symptoms  Total Time spent with patient: 45 minutes   Psychiatric  History- this is patient's first psychiatric admission, this is first suicidal attempt, denies violence , denies hallucinations, does not endorse any clear hypomania/mania, does state she has  short lived mood swings, no panic, describes some social phobia. In the past has been on Abilify several years ago, but no psychiatric medications since then. Minimizes any prior history of severe depression or post partum depression.    Past Medical History: denies any medical issues. Smokes 1 PPD. NKDA. Past Medical History  Diagnosis Date  . Medical history non-contributory     Past Surgical History  Procedure Laterality Date  . No past surgeries     Family History:  Parents separated, patient states she has little contact with parents, has one brother and two sisters, no mental illness or suicides in the family, father has history of substance dependence  Family History  Problem Relation Age of Onset  . Cancer Maternal Grandmother    Social History: Single, lives with boyfriend, has 20 month old child, who is currently with father.  Describes recent increased relationship stress due to episode of infidelity, but now she states " things are better, we talked about it and we are all right". 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 Level. History  Alcohol Use No     History  Drug Use  . Yes  . Special: Benzodiazepines, Hydrocodone    Comment: xanax and percocets first time in one month    History   Social History  . Marital Status: Single    Spouse Name: N/A  . Number of Children: N/A  . Years of Education: N/A   Social History Main Topics  . Smoking status: Current Every Day Smoker -- 1.00 packs/day for 5 years    Types: Cigarettes  . Smokeless tobacco: Never Used  . Alcohol Use: No  .  Drug Use: Yes    Special: Benzodiazepines, Hydrocodone     Comment: xanax and percocets first time in one month  . Sexual Activity: Not Currently    Birth Control/ Protection: None   Other Topics Concern  . None   Social History Narrative   Additional Social History:    Longest period of sobriety (when/how long): one month  Musculoskeletal: Strength & Muscle  Tone: within normal limits Gait & Station: normal Patient leans: N/A  Psychiatric Specialty Exam: Physical Exam  Review of Systems  Constitutional: Negative for fever and chills.  Respiratory: Negative for cough and shortness of breath.   Cardiovascular: Negative for chest pain.  Gastrointestinal: Negative for nausea, vomiting and diarrhea.  Genitourinary: Negative for dysuria, urgency and frequency.  Skin: Negative for rash.  Neurological: Negative for seizures and headaches.    Blood pressure 90/62, pulse 104, temperature 98.4 F (36.9 C), temperature source Oral, resp. rate 16, height 5\' 1"  (1.549 m), weight 101 lb (45.813 kg), last menstrual period 12/17/2014, not currently breastfeeding.Body mass index is 19.09 kg/(m^2).  General Appearance: fairly groomed   Patent attorneyye Contact::  Good  Speech:  Normal Rate  Volume:  Normal  Mood:  states that at this time she is feeling " level", and denies depression  Affect:  Appropriate and Full Range  Thought Process:  Goal Directed and Linear  Orientation:  Full (Time, Place, and Person)  Thought Content:  no hallucinations, no delusions, not internally preoccupied   Suicidal Thoughts:  No at this time denies any thoughts of hurting self and  contracts for safety on unit   Homicidal Thoughts:  No  Memory: recent and remote grossly intact   Judgement:  Fair  Insight:  Fair  Psychomotor Activity:  Normal  Concentration:  Good  Recall:  Good  Fund of Knowledge:Good  Language: Good  Akathisia:  Negative  Handed:  Right  AIMS (if indicated):     Assets:  Communication Skills Desire for Improvement Physical Health Resilience  ADL's:  Intact  Cognition: WNL  Sleep:  Number of Hours: 6.75   Risk to Self: Is patient at risk for suicide?: Yes What has been your use of drugs/alcohol within the last 12 months?: Patient reports abusing up to 100 mg of Roxy per day Risk to Others:   Prior Inpatient Therapy:   Prior Outpatient Therapy:     Alcohol Screening: 1. How often do you have a drink containing alcohol?: Never 9. Have you or someone else been injured as a result of your drinking?: No 10. Has a relative or friend or a doctor or another health worker been concerned about your drinking or suggested you cut down?: No Alcohol Use Disorder Identification Test Final Score (AUDIT): 0 Brief Intervention: Patient declined brief intervention  Allergies:  No Known Allergies Lab Results:  Results for orders placed or performed during the hospital encounter of 12/29/14 (from the past 48 hour(s))  TSH     Status: None   Collection Time: 12/30/14  7:55 AM  Result Value Ref Range   TSH 0.926 0.350 - 4.500 uIU/mL    Comment: [facility]   Current Medications: Current Facility-Administered Medications  Medication Dose Route Frequency Provider Last Rate Last Dose  . acetaminophen (TYLENOL) tablet 650 mg  650 mg Oral Q6H PRN Kerry HoughSpencer E Simon, PA-C      . alum & mag hydroxide-simeth (MAALOX/MYLANTA) 200-200-20 MG/5ML suspension 30 mL  30 mL Oral Q4H PRN Kerry HoughSpencer E Simon,  PA-C      . feeding supplement (RESOURCE BREEZE) (RESOURCE BREEZE) liquid 1 Container  1 Container Oral BID BM Normand Sloop, RD      . hydrOXYzine (ATARAX/VISTARIL) tablet 25 mg  25 mg Oral Q6H PRN Kerry Hough, PA-C      . ibuprofen (ADVIL,MOTRIN) tablet 600 mg  600 mg Oral Q6H PRN Kerry Hough, PA-C      . magnesium hydroxide (MILK OF MAGNESIA) suspension 30 mL  30 mL Oral Daily PRN Kerry Hough, PA-C      . nicotine (NICODERM CQ - dosed in mg/24 hours) patch 21 mg  21 mg Transdermal Daily Craige Cotta, MD   21 mg at 12/30/14 0655  . traZODone (DESYREL) tablet 50 mg  50 mg Oral QHS,MR X 1 Spencer E Simon, PA-C   50 mg at 12/29/14 2200   PTA Medications: Prescriptions prior to admission  Medication Sig Dispense Refill Last Dose  . ibuprofen (ADVIL,MOTRIN) 600 MG tablet Take 1 tablet (600 mg total) by mouth every 6  (six) hours. (Patient not taking: Reported on 12/30/2014) 30 tablet 0 Not Taking    Previous Psychotropic Medications:  Was on Abilify years ago, states took it only briefly and was not well tolerated .  Substance Abuse History in the last 12 months:  History of opiate dependence, mainly was using opiate analgesics, denies heroin abuse, denies IVDA. Last used opiates 4 days ago, when she used x 1 day, prior to that had been sober for several weeks. Denies alcohol , cannabis, cocaine abuse . Has been in a inpatient detox 1-2 months ago.     Consequences of Substance Abuse: some psychosocial stress  Results for orders placed or performed during the hospital encounter of 12/29/14 (from the past 72 hour(s))  TSH     Status: None   Collection Time: 12/30/14  7:55 AM  Result Value Ref Range   TSH 0.926 0.350 - 4.500 uIU/mL    Comment: Performed at [facility]    Observation Level/Precautions:  15 minute checks  Laboratory:  as needed   Psychotherapy:  Milieu, group therapy   Medications:  At this time patient is not interested in starting any psychiatric medication/antidepressant, as she states " I am feeling better".   Consultations:  If needed   Discharge Concerns:   Relationship stressors   Estimated LOS: 5-6 days   Other:     Psychological Evaluations: No   Treatment Plan Summary: Daily contact with patient to assess and evaluate symptoms and progress in treatment, Medication management, Plan continue inpatient treatment and medications as above   Medical Decision Making:  Review of Psycho-Social Stressors (1), Review or order clinical lab tests (1), Established Problem, Worsening (2) and Review of Medication Regimen & Side Effects (2)  I certify that inpatient services furnished can reasonably be expected to improve the patient's condition.   Xzavien Harada, Madaline Guthrie 4/19/20161:58 PM

## 2014-12-30 NOTE — Progress Notes (Signed)
Pt attended spiritual caregroupongriefand loss facilitated by counseling intern [name] Maxwel Meadowcroft. Groupopened with brief discussion and psycho-social ed aroundgriefand loss in relationships and in relation to self - identifying life patterns, circumstances, changes that cause losses. Establishedgroupnorm of speaking from own life experience.Groupgoal of establishing open and affirming space for members to share loss and experience withgrief, normalizegriefexperience and provide psycho social education andgriefsupport.Groupdrew on narrative and [place] therapeutic modalities.   [name] was present for the beginning of group but stepped out for treatment team. Returned to group but did not contribute to group discussion.  [name] Karene Bracken Counseling Intern

## 2014-12-30 NOTE — Progress Notes (Signed)
NUTRITION ASSESSMENT  Pt identified as at risk on the Malnutrition Screen Tool  INTERVENTION: 1. Educated patient on the importance of nutrition and encouraged intake of food and beverages. 2. Discussed weight goals. 3. Supplements: Resource Breeze po BID, each supplement provides 250 kcal and 9 grams of protein  NUTRITION DIAGNOSIS: Unintentional weight loss related to sub-optimal intake as evidenced by pt report.   Goal: Pt to meet >/= 90% of their estimated nutrition needs.  Monitor:  PO intake  Assessment:  Pt reports "I do not have a problem with eating." She has not eaten since admission due to fatigue. She is currently hungry for lunch. Reports weight loss over the past year due to an addiction to "pain pills." usual body weight 1 year ago is ~120 lbs. Current weight is 101 lbs. Agreed to try nutritional supplements to increase po intake.   Labs and medications reviewed.   19 y.o. female  Height: Ht Readings from Last 1 Encounters:  12/29/14 5\' 1"  (1.549 m) (10 %*, Z = -1.29)   * Growth percentiles are based on CDC 2-20 Years data.    Weight: Wt Readings from Last 1 Encounters:  12/29/14 101 lb (45.813 kg) (4 %*, Z = -1.73)   * Growth percentiles are based on CDC 2-20 Years data.    Weight Hx: Wt Readings from Last 10 Encounters:  12/29/14 101 lb (45.813 kg) (4 %*, Z = -1.73)  12/29/14 101 lb (45.813 kg) (4 %*, Z = -1.73)  07/21/14 120 lb (54.432 kg) (36 %*, Z = -0.36)  05/01/14 120 lb (54.432 kg) (37 %*, Z = -0.34)  04/15/14 146 lb (66.225 kg) (78 %*, Z = 0.79)  04/14/14 148 lb (67.132 kg) (80 %*, Z = 0.85)  03/21/14 145 lb (65.772 kg) (78 %*, Z = 0.76)  02/11/14 143 lb (64.864 kg) (76 %*, Z = 0.70)  01/02/14 136 lb (61.689 kg) (68 %*, Z = 0.46)  12/04/13 128 lb 8 oz (58.287 kg) (56 %*, Z = 0.15)   * Growth percentiles are based on CDC 2-20 Years data.    BMI:  Body mass index is 19.09 kg/(m^2). Pt meets criteria for normal body weight based on current  BMI.  Estimated Nutritional Needs: Kcal: 25-30 kcal/kg Protein: > 1 gram protein/kg Fluid: 1 ml/kcal  Diet Order: Diet regular Room service appropriate?: Yes; Fluid consistency:: Thin Pt is also offered choice of unit snacks mid-morning and mid-afternoon.  Pt is eating as desired.   Lab results and medications reviewed.   [name] MS, RD, LDN [phone]

## 2014-12-30 NOTE — Clinical Social Work Note (Signed)
CSW spoke with patient's boyfriend, with whom she lives.  He advised of concerns regarding patient taking pills and wanting her to be drug free.  He advised this has been going on for a while and he feels she OD'd as a way of getting his attention.

## 2014-12-30 NOTE — Plan of Care (Signed)
Problem: Diagnosis: Increased Risk For Suicide Attempt Goal: STG-Patient Will Attend All Groups On The Unit Outcome: Progressing Patient is attending unit groups today.  Problem: Ineffective individual coping Goal: STG: Patient will remain free from self harm Outcome: Progressing Patient remains free from self harm. 15 minute checks continued per protocol for patient safety.

## 2014-12-30 NOTE — Progress Notes (Signed)
  Pt was pleasant and cooperative but flat affect during the adm process. When asked the circumstances surrounding her adm pt stated, "I tried to hurt myself the other night". However, pt stated that she wasn't trying to kill herself, just trying to get attention after an argument with her fiance.  Pt informed the writer that she doesn't feel she needs to be here. Pt tested positive for benzo's and opiates and has a prescription for neither.  Pt voiced no questions or concerns.

## 2014-12-30 NOTE — [facility] Counselor (Signed)
Adult Comprehensive Assessment  Patient ID: [name], female   DOB: Feb 06, 1996, 19 y.o.   MRN: [id number]  Information Source: Information source: Patient  Current Stressors:  Educational / Learning stressors: None Employment / Job issues: Patient has never been employed Family Relationships: Patient reports having a disagreement with boyfriend with whom she is living and taking pills to get his attention Surveyor, quantity / Lack of resources (include bankruptcy): None Housing / Lack of housing: None Physical health (include injuries & life threatening diseases): None Social relationships: Does not like being arounds crowds. Substance abuse: Patient reports she abuses opiods Bereavement / Loss: None  Living/Environment/Situation:  Living Arrangements: Spouse/significant other, Other relatives Living conditions (as described by patient or guardian): good How long has patient lived in current situation?: Two years What is atmosphere in current home: Comfortable, Supportive  Family History:  Marital status: Single Does patient have children?: Yes How many children?: 1 How is patient's relationship with their children?: loves her 33 old daughter  Childhood History:  By whom was/is the patient raised?: Mother Additional childhood history information: Patient reports having a good childhood Description of patient's relationship with caregiver when they were a child: good Patient's description of current relationship with people who raised him/her: Okay Does patient have siblings?: Yes Number of Siblings: 3 Description of patient's current relationship with siblings: Good relationship with one sister - distant relation with other sister and brother Did patient suffer any verbal/emotional/physical/sexual abuse as a child?: No Did patient suffer from severe childhood neglect?: No Has patient ever been sexually abused/assaulted/raped as an adolescent or adult?: No Was the patient  ever a victim of a crime or a disaster?: No Witnessed domestic violence?: No Has patient been effected by domestic violence as an adult?: No  Education:  Highest grade of school patient has completed: 10th grade Currently a student?: No Learning disability?: No  Employment/Work Situation:      Warehouse manager resources: Medicaid Does patient have a Lawyer or guardian?: No  Alcohol/Substance Abuse:   What has been your use of drugs/alcohol within the last 12 months?: Patient reports abusing up to 100 mg of Roxy per day If attempted suicide, did drugs/alcohol play a role in this?: No Alcohol/Substance Abuse Treatment Hx: Past Tx, Inpatient If yes, describe treatment: [facility] in March 2016 Has alcohol/substance abuse ever caused legal problems?: No  Social Support System:   Forensic psychologist System: None Describe Community Support System: N/A Type of faith/religion: None How does patient's faith help to cope with current illness?: N/A  Leisure/Recreation:   Leisure and Hobbies: Programmer, systems  Strengths/Needs:   What things does the patient do well?: Unable to identify In what areas does patient struggle / problems for patient: Anger  Discharge Plan:   Does patient have access to transportation?: Yes Will patient be returning to same living situation after discharge?: Yes Currently receiving community mental health services: No If no, would patient like referral for services when discharged?: Yes (What county?) [facility] [place]) Does patient have financial barriers related to discharge medications?: No  Summary/Recommendations:  [name] is an 19 y.o. female, single, Caucasian who presents unaccompanied to [facility] ED via EMS after intentionally overdosing on approximately 25 tabs of HCTZ and 2 tabs of Xanax at 0200. Pt states she had a conflict with her boyfriend tonight because she cheated on him and he threatened to break up with  her. Pt and boyfriend have an 33-month-old daughter and Pt lives  with her boyfriend and his grandparents. Pt states this overdose was not a suicide attempt and she took the pills "for attention and to scare my boyfriend." She denies current suicidal ideation or any history of suicide attempts. She denies any history of intentional self-injurious behavior. She denies any homicidal ideation or history of aggressive behavior. Pt denies access to firearms or other weapons. She denies any history of psychotic symptoms. 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 She states she relapsed on Percocet two days ago and also took two Xanax.  She will benefit from crisis stabilization, evaluation for medication, psycho-education groups for coping skills development, group therapy and case management for discharge planning.     Beyla Loney, Joesph JulyQuylle Hairston. 12/30/2014

## 2014-12-30 NOTE — [facility] Suicide Risk Assessment (Signed)
[facility]   Nursing information obtained from:  Patient Demographic factors:  Caucasian, Low socioeconomic status, Unemployed Current Mental Status:  NA Loss Factors:  Financial problems / change in socioeconomic status Historical Factors:  NA Risk Reduction Factors:  Responsible for children under 20 years of age, Sense of responsibility to family, Living with another person, especially a relative, Positive social support Total Time spent with patient: 45 minutes Principal Problem: Suicide attempt by drug ingestion Diagnosis:   Patient Active Problem List   Diagnosis Date Noted  . Suicide attempt by drug ingestion [T50.902A] 12/30/2014  . MDD (major depressive disorder), recurrent episode, severe [F33.2] 12/29/2014  . Vaginal delivery [O80] 04/16/2014  . Illicit drug use [F19.90] 09/03/2013  . Supervision of normal first pregnancy [Z34.00] 09/02/2013     Continued Clinical Symptoms:  Alcohol Use Disorder Identification Test Final Score (AUDIT): 0 The "Alcohol Use Disorders Identification Test", Guidelines for Use in Primary Care, Second Edition.  World Science writerHealth Organization [facility](WHO). Score between 0-7:  no or low risk or alcohol related problems. Score between 8-15:  moderate risk of alcohol related problems. Score between 16-19:  high risk of alcohol related problems. Score 20 or above:  warrants further diagnostic evaluation for alcohol dependence and treatment.   CLINICAL FACTORS:  19 year old single female, recent impulsive overdose on medications that were her boyfriend's grandfather's. Overdose was impulsive during argument with BF. Denies prior depression and states she was doing well up to overdose. At this time relatively euthymic and minimizing any severe depression or depressive symtoms   Musculoskeletal: Strength & Muscle Tone: within normal limits Gait & Station: normal Patient leans: N/A  Psychiatric Specialty Exam: Physical Exam  ROS   Blood pressure 90/62, pulse 104, temperature 98.4 F (36.9 C), temperature source Oral, resp. rate 16, height 5\' 1"  (1.549 m), weight 101 lb (45.813 kg), last menstrual period 12/17/2014, not currently breastfeeding.Body mass index is 19.09 kg/(m^2).  SEE ADMIT NOTE MSE                                                        COGNITIVE FEATURES THAT CONTRIBUTE TO RISK:  Closed-mindedness    SUICIDE RISK:   Moderate:  Frequent suicidal ideation with limited intensity, and duration, some specificity in terms of plans, no associated intent, good self-control, limited dysphoria/symptomatology, some risk factors present, and identifiable protective factors, including available and accessible social support.  PLAN OF CARE: Patient will be admitted to inpatient psychiatric unit for stabilization and safety. Will provide and encourage milieu participation. Provide medication management and maked adjustments as needed.  Will follow daily.    Medical Decision Making:  Review of Psycho-Social Stressors (1), Review or order clinical lab tests (1), Established Problem, Worsening (2) and Review of Medication Regimen & Side Effects (2)  I certify that inpatient services furnished can reasonably be expected to improve the patient's condition.   Jsoeph Podesta 12/30/2014, 2:26 PM

## 2014-12-30 NOTE — [facility] Group Notes (Signed)
[facility] Group Notes:  (Nursing/MHT/Case Management/Adjunct)  Date:  12/30/2014  Time:  0900am  Type of Therapy:  Nurse Education  Participation Level:  Minimal  Participation Quality:  Attentive  Affect:  Blunted  Cognitive:  Alert  Insight:  Appropriate  Engagement in Group:  Engaged  Modes of Intervention:  Discussion, Education and Support  Summary of Progress/Problems: Patient attended group, remained engaged and responded minimally when prompted.  Lendell CapriceGuthrie, Vaani Morren A 12/30/2014, 2:02 PM

## 2014-12-30 NOTE — [facility] Group Notes (Signed)
[facility] LCSW Group Therapy      Feelings About Diagnosis 1:15 - 2:30 PM         12/30/2014 3:19 PM    Type of Therapy:  Group Therapy  Participation Level:  Minimal  Participation Quality:  Appropriate  Affect:  Flat, Depressed  Cognitive:  Alert and Appropriate  Insight:  Developing/Improving   Engagement in Therapy:  Developing/Improving   Modes of Intervention:  Discussion, Education, Exploration, Problem-Solving, Rapport Building, Support  Summary of Progress/Problems:  Patient actively participated in group. Patient discussed past and present diagnosis and the effects it has had on  life.  Patient talked about family and society being judgmental and the stigma associated with having a mental health diagnosis.  She shared she feels different and is concerned about how others feel.    Wynn BankerHodnett, Alick Lecomte Hairston 12/30/2014  3:19 PM

## 2014-12-30 NOTE — [facility] Suicide Risk Assessment (Signed)
[facility] INPATIENT:  Family/Significant Other Suicide Prevention Education  Suicide Prevention Education:  Education Completed; Gwynn BurlyJonathan Smith, Boyfriend, [phone];  has been identified by the patient as the family member/significant other with whom the patient will be residing, and identified as the person(s) who will aid the patient in the event of a mental health crisis (suicidal ideations/suicide attempt).  With written consent from the patient, the family member/significant other has been provided the following suicide prevention education, prior to the and/or following the discharge of the patient.  The suicide prevention education provided includes the following:  Suicide risk factors  Suicide prevention and interventions  National Suicide Hotline telephone number  [facility] assessment telephone number  [facility] Emergency Assistance 911  [facility] and/or Residential Mobile Crisis Unit telephone number  Request made of family/significant other to:  Remove weapons (e.g., guns, rifles, knives), all items previously/currently identified as safety concern.   Boyfriend advised patient does not have access to weapons.     Remove drugs/medications (over-the-counter, prescriptions, illicit drugs), all items previously/currently identified as a safety concern.  The family member/significant other verbalizes understanding of the suicide prevention education information provided.  The family member/significant other agrees to remove the items of safety concern listed above.  Wynn BankerHodnett, Kealey Kemmer Hairston 12/30/2014, 3:19 PM

## 2014-12-31 NOTE — Progress Notes (Signed)
[facility] MD Progress Note  12/31/2014 1:57 PM [name]  MRN:  [id number] Subjective: Patient states she feels "OK", she minimizes depression. She does state that she feels slightly anxious. She is hoping for discharge soon, and states she misses her child and boyfriend. Objective : I have discussed case with treatment team and have met with patient. She is visible in day room. No disruptive behaviors on unit. She is denying depression/sadness or neuro-vegetative symptoms of depression. She does endorse some anxiety. She is not on any current standing psychiatric medications. She is on Vistaril PRNs for anxiety. Today we discussed her history of opiate dependence at fuller length, states she had been using regularly , had gone to a rehab and stopper for a period of time, but relapsed after her boyfriend went to jail. She has been sober since his return. She has had some cravings  At times. We discussed the importance of avoiding people, places and situations she correlates or associates with drug use in order to minimize cravings, and we also encouraged smoking cessation. TSH WNL.   Principal Problem: Suicide attempt by drug ingestion Diagnosis:   Patient Active Problem List   Diagnosis Date Noted  . Suicide attempt by drug ingestion [T50.902A] 12/30/2014  . MDD (major depressive disorder), recurrent episode, severe [F33.2] 12/29/2014  . Vaginal delivery [O80] 04/16/2014  . Illicit drug use [D97.41] 09/03/2013  . Supervision of normal first pregnancy [Z34.00] 09/02/2013   Total Time spent with patient: 25 minutes    Past Medical History:  Past Medical History  Diagnosis Date  . Medical history non-contributory     Past Surgical History  Procedure Laterality Date  . No past surgeries     Family History:  Family History  Problem Relation Age of Onset  . Cancer Maternal Grandmother    Social History:  History  Alcohol Use No     History  Drug Use  . Yes  . Special:  Benzodiazepines, Hydrocodone    Comment: xanax and percocets first time in one month    History   Social History  . Marital Status: Single    Spouse Name: N/A  . Number of Children: N/A  . Years of Education: N/A   Social History Main Topics  . Smoking status: Current Every Day Smoker -- 1.00 packs/day for 5 years    Types: Cigarettes  . Smokeless tobacco: Never Used  . Alcohol Use: No  . Drug Use: Yes    Special: Benzodiazepines, Hydrocodone     Comment: xanax and percocets first time in one month  . Sexual Activity: Not Currently    Birth Control/ Protection: None   Other Topics Concern  . None   Social History Narrative   Additional History:    Sleep: Good  Appetite:  Good   Assessment:   Musculoskeletal: Strength & Muscle Tone: within normal limits Gait & Station: normal Patient leans: N/A   Psychiatric Specialty Exam: Physical Exam  ROS  Blood pressure 103/61, pulse 79, temperature 98.7 F (37.1 C), temperature source Oral, resp. rate 16, height _0  (1.549 m), weight 101 lb (45.813 kg), last menstrual period 12/17/2014, not currently breastfeeding.Body mass index is 19.09 kg/(m^2).  General Appearance: Casual  Eye Contact::  Good  Speech:  Normal Rate  Volume:  Normal  Mood:  denies depression, presents somewhat anxious today  Affect:  Appropriate and anxious  Thought Process:  Goal Directed and Linear  Orientation:  Full (Time, Place, and Person)  Thought Content:  no hallucinations, no delusions, not internally preoccupied   Suicidal Thoughts:  No  Homicidal Thoughts:  No  Memory:  recent and remote grossly intact   Judgement:  Fair  Insight:  Fair  Psychomotor Activity:  Normal  Concentration:  Good  Recall:  Good  Fund of Knowledge:Good  Language: Good  Akathisia:  Negative  Handed:  Right  AIMS (if indicated):     Assets:  Desire for Improvement Housing Physical Health Resilience Social Support  ADL's:  Improving   Cognition: WNL   Sleep:  Number of Hours: 6.75     Current Medications: Current Facility-Administered Medications  Medication Dose Route Frequency Provider Last Rate Last Dose  . acetaminophen (TYLENOL) tablet 650 mg  650 mg Oral Q6H PRN Laverle Hobby, PA-C      . alum & mag hydroxide-simeth (MAALOX/MYLANTA) 200-200-20 MG/5ML suspension 30 mL  30 mL Oral Q4H PRN Laverle Hobby, PA-C      . feeding supplement (RESOURCE BREEZE) (RESOURCE BREEZE) liquid 1 Container  1 Container Oral BID BM Dorann Ou, RD   1 Container at 12/30/14 1426  . hydrOXYzine (ATARAX/VISTARIL) tablet 25 mg  25 mg Oral Q6H PRN Laverle Hobby, PA-C      . ibuprofen (ADVIL,MOTRIN) tablet 600 mg  600 mg Oral Q6H PRN Laverle Hobby, PA-C      . magnesium hydroxide (MILK OF MAGNESIA) suspension 30 mL  30 mL Oral Daily PRN Laverle Hobby, PA-C      . nicotine (NICODERM CQ - dosed in mg/24 hours) patch 21 mg  21 mg Transdermal Daily Jenne Campus, MD   21 mg at 12/31/14 0935  . traZODone (DESYREL) tablet 50 mg  50 mg Oral QHS,MR X 1 Laverle Hobby, PA-C   50 mg at 12/30/14 2125    Lab Results:  Results for orders placed or performed during the hospital encounter of 12/29/14 (from the past 48 hour(s))  TSH     Status: None   Collection Time: 12/30/14  7:55 AM  Result Value Ref Range   TSH 0.926 0.350 - 4.500 uIU/mL    Comment: Performed at [facility]    Physical Findings: AIMS: Facial and Oral Movements Muscles of Facial Expression: None, normal Lips and Perioral Area: None, normal Jaw: None, normal Tongue: None, normal,Extremity Movements Upper (arms, wrists, hands, fingers): None, normal Lower (legs, knees, ankles, toes): None, normal, Trunk Movements Neck, shoulders, hips: None, normal, Overall Severity Severity of abnormal movements (highest score from questions above): None, normal Incapacitation due to abnormal movements: None, normal Patient's awareness of abnormal movements (rate only  patient's report): No Awareness, Dental Status Current problems with teeth and/or dentures?: No Does patient usually wear dentures?: No  CIWA:  CIWA-Ar Total: 0 COWS:  COWS Total Score: 1   Assessment- patient minimizes /denies depression and states recent suicidal ideations was impulsive, and not in the context of depressive episode. She is denying any current SI. She does appear mildly anxious, and describes intermittent opiate cravings ( has a history of opiate dependence). She is not interested in any standing psychiatric medication at this time and is focused on being discharged soon.    Treatment Plan Summary: Daily contact with patient to assess and evaluate symptoms and progress in treatment, Medication management, Plan inpatient psychiatric admission  and continue management as below  No standing psychiatric medications at this time- consider discharge soon as she continues to stabilize . Trazodone PRN Insomnia Vistaril PRN  Anxiety. Encouraged smoking cessation and 12 step program participation.   Medical Decision Making:  Established Problem, Stable/Improving (1), Review of Psycho-Social Stressors (1), Review or order clinical lab tests (1) and Review of Medication Regimen & Side Effects (2)     [name] 12/31/2014, 1:57 PM

## 2014-12-31 NOTE — [facility] Group Notes (Signed)
[facility] LCSW Group Therapy  Emotional Regulation 1:15 - 2: 30 PM        12/31/2014  3:21 PM    Type of Therapy:  Group Therapy  Participation Level:  Appropriate  Participation Quality:  Appropriate  Affect:  Appropriate  Cognitive:  Attentive Appropriate  Insight:  Developing/Improving Engaged  Engagement in Therapy:  Developing/Improving Engaged  Modes of Intervention:  Discussion Exploration Problem-Solving Supportive  Summary of Progress/Problems:  Group topic was emotional regulations.  Patient participated in the discussion and was able to identify an emotion that needed to regulated.  Patient shared she has to learn to better control her anger.  She stated normally she does not act out on anger but can say very hurtful things.  Wynn BankerHodnett, Leana Springston Hairston 12/31/2014   3:21 PM

## 2014-12-31 NOTE — Progress Notes (Signed)
D   Pt endorses anxiety and depression but reports improved mood since coming to hospital   She spends a lot of time on the phone    She attends and participates in groups   She interacts appropriately with others A   Verbal support given   Medications administered and effectiveness monitored   Q 15 min checks R   Pt safe at present

## 2014-12-31 NOTE — [facility] Group Notes (Signed)
[facility]   12/31/2014 9:44 AM    Participation Quality:  Appropraite  Mood/Affect:  Appropriate  Depression Rating:  3  Anxiety Rating:  3  Thoughts of Suicide:  No  Will you contract for safety?   NA  Current AVH:  No  Plan for Discharge/Comments:  Patient attended discharge planning group and actively participated in group. She will [facility] at [facility].  Suicide prevention education reviewed and SPE document provided.   Transportation Means: Patient has transportation.   Supports:  Patient has a support system.   Wynn BankerHodnett, [name]   12/31/2014  9:44 AM

## 2015-01-01 ENCOUNTER — Encounter (HOSPITAL_COMMUNITY): Payer: Self-pay | Admitting: Registered Nurse

## 2015-01-01 DIAGNOSIS — T6592XA Toxic effect of unspecified substance, intentional self-harm, initial encounter: Secondary | ICD-10-CM | POA: Insufficient documentation

## 2015-01-01 MED ORDER — TRAZODONE HCL 50 MG PO TABS: 50.0000 mg | ORAL_TABLET | Freq: Every evening | ORAL | Status: DC | PRN

## 2015-01-01 MED ORDER — HYDROXYZINE HCL 25 MG PO TABS: 25.0000 mg | ORAL_TABLET | Freq: Four times a day (QID) | ORAL | Status: DC | PRN

## 2015-01-01 NOTE — Progress Notes (Addendum)
  [facility] :  Will you be returning to the same living situation after discharge:  Yes,  Patient is returning home with boyfriend and his family. At discharge, do you have transportation home?: Yes,  Patient will arrange transportation. Do you have the ability to pay for your medications: Yes,  Patient has Medicaid.  Release of information consent forms completed, signed and placed in the chart.  Patient to Follow up at: Follow-up Information    Follow up with [facility] On 01/05/2015.   Why:  [facility], 2015 between 8 AM - 10:30 AM    Contact information:   [address] [place], [place]   [id number]  [phone]      Patient denies SI/HI: Patient will no longer endorse SI/other thought self harm.  Safety Planning and Suicide Prevention discussed:  .Reviewed with all patients during discharge planning group   Have you used any form of tobacco in the last 30 days? (Cigarettes, Smokeless Tobacco, Cigars, and/or Pipes): Yes  Has patient been referred to the Quitline?: Patient initially stated she wanted a referral to Quitline but refused when CSW went back to her to sign consent.  Wynn BankerHodnett, Gracianna Vink Hairston 01/01/2015, 9:55 AM

## 2015-01-01 NOTE — Progress Notes (Signed)
Patient packed and verbalizes readiness to discharge. Teaching explained, Rx's given and sample meds provided. Belongings returned. Patient verbalizes understanding. Denies SI/HI and is discharged to friend in stable condition. Lawrence MarseillesFriedman, Sarai January Eakes

## 2015-01-01 NOTE — Progress Notes (Signed)
Met with patient 1:1. She states she is for discharge today and feels ready. Affect appropriate, mood slightly anxious. Denies any pain or physical problems at this time. Patient offered support. Denies SI/HI and remains safe. Jamie Kato

## 2015-01-01 NOTE — [facility] Suicide Risk Assessment (Signed)
[facility] Discharge Suicide Risk Assessment   Demographic Factors:  19 year old female, has one child aged 19 months, lives with her boyfriend   Total Time spent with patient: 30 minutes  Musculoskeletal: Strength & Muscle Tone: within normal limits Gait & Station: normal Patient leans: N/A  Psychiatric Specialty Exam: Physical Exam  ROS  Blood pressure 99/59, pulse 99, temperature 98.2 F (36.8 C), temperature source Oral, resp. rate 16, height  (1.549 m), weight 101 lb (45.813 kg), last menstrual period 12/17/2014, not currently breastfeeding.Body mass index is 19.09 kg/(m^2).  General Appearance: improved grooming   Eye Contact::  Good  Speech:  Normal Rate409  Volume:  Normal  Mood:  Euthymic  Affect:  Appropriate  Thought Process:  Goal Directed and Linear  Orientation:  Full (Time, Place, and Person)  Thought Content:  no hallucinations, no delusions , not internally preoccupied   Suicidal Thoughts:  No  Homicidal Thoughts:  No  Memory:  Recent and remote grossly intact   Judgement:  Other:  improved   Insight:  improved   Psychomotor Activity:  Normal  Concentration:  Good  Recall:  Good  Fund of Knowledge:Good  Language: Good  Akathisia:  Negative  Handed:  Right  AIMS (if indicated):     Assets:  Communication Skills Desire for Improvement Housing Physical Health Social Support  Sleep:  Number of Hours: 6.75  Cognition: WNL  ADL's:  Improved    Have you used any form of tobacco in the last 30 days? (Cigarettes, Smokeless Tobacco, Cigars, and/or Pipes): Yes  Has this patient used any form of tobacco in the last 30 days? (Cigarettes, Smokeless Tobacco, Cigars, and/or Pipes) Yes, A prescription for an FDA-approved tobacco cessation medication was offered at discharge and the patient refused  Mental Status Per Nursing Assessment::   On Admission:  NA  Current Mental Status by Physician: At this time patient is much improved - she denies depression, denies  sadness, has no suicidal ideations, no homicidal ideations, no psychotic symptoms and is looking forward to discharge and seeing her boyfriend and child.  Loss Factors: Argument with BF.,  Historical Factors: This is first psychiatric admission, no prior suicidal attempts, history of opiate dependence, sober at this time.  Risk Reduction Factors:   Responsible for children under 30 years of age, Sense of responsibility to family, Living with another person, especially a relative and Positive coping skills or problem solving skills  Continued Clinical Symptoms:  As above, much improved and currently denies depression and no SI.  Cognitive Features That Contribute To Risk:  No gross cognitive deficits noted upon discharge. Is alert , attentive, and oriented x 3     Suicide Risk:  Mild:  Suicidal ideation of limited frequency, intensity, duration, and specificity.  There are no identifiable plans, no associated intent, mild dysphoria and related symptoms, good self-control (both objective and subjective assessment), few other risk factors, and identifiable protective factors, including available and accessible social support.  Principal Problem: Suicide attempt by drug ingestion Discharge Diagnoses:  Patient Active Problem List   Diagnosis Date Noted  . Suicide attempt by drug ingestion [T50.902A] 12/30/2014  . MDD (major depressive disorder), recurrent episode, severe [F33.2] 12/29/2014  . Vaginal delivery [O80] 04/16/2014  . Illicit drug use [F19.90] 09/03/2013  . Supervision of normal first pregnancy [Z34.00] 09/02/2013    Follow-up Information    Follow up with [facility] On 01/05/2015.   Why:  You are scheduled with [facility]'s hospital discharge clinic on  Monday, January 05, 2015 between 8 AM - 10:30 AM    Contact information:   [address] [place], [place]   [address]  [phone]      Plan Of Care/Follow-up recommendations:  Activity:  as tolerated Diet:  Regular Tests:   NA Other:  See below  Is patient on multiple antipsychotic therapies at discharge:  No   Has Patient had three or more failed trials of antipsychotic monotherapy by history:  No  Recommended Plan for Multiple Antipsychotic Therapies: NA   Patient is leaving unit in good spirits. Plans to return home. Encouraged to go to 12 step meetings .    COBOS, FERNANDO 01/01/2015, 10:33 AM

## 2015-01-01 NOTE — Discharge Summary (Signed)
Physician Discharge Summary Note  Patient:  [name] is an 19 y.o., female MRN:  [id number] DOB:  04-07-1996 Patient phone:  [phone] (home)  Patient address:   [address] [place] [address],  Total Time spent with patient: 01/01/2015  Date of Admission:  12/29/2014 Date of Discharge: 01/01/2015  Reason for Admission:  Per H&P admission:  89 year old single female, lives with boyfriend. States she was " doing OK " until recently when she had a heated argument with her boyfriend, due to infidelity accusations. This led for her to feel intensely emotional, and she impulsively overdosed on medications " that are his grandfather's heart medication". ( As per chart it was HCTZ) States " I guess I took about 20". She then called 911, and was brought to hospital. This was two days ago.  States that before this argument she had not been feeling depressed or suicidal and reiterates " I felt fine". She also states she feels better now, and no longer has any SI.   Principal Problem: Suicide attempt by drug ingestion Discharge Diagnoses: Patient Active Problem List   Diagnosis Date Noted  . Suicide attempt by substance overdose [T65.92XA]   . Suicide attempt by drug ingestion [T50.902A] 12/30/2014  . MDD (major depressive disorder), recurrent episode, severe [F33.2] 12/29/2014  . Vaginal delivery [O80] 04/16/2014  . Illicit drug use [F19.90] 09/03/2013  . Supervision of normal first pregnancy [Z34.00] 09/02/2013    Musculoskeletal: Strength & Muscle Tone: within normal limits Gait & Station: normal Patient leans: N/A  Psychiatric Specialty Exam:  See Suicide Risk Assessment Physical Exam  Constitutional: She is oriented to person, place, and time.  Neck: Normal range of motion.  Respiratory: Effort normal.  Musculoskeletal: Normal range of motion.  Neurological: She is alert and oriented to person, place, and time.    Review of Systems  Psychiatric/Behavioral: Positive for  substance abuse. Negative for suicidal ideas, hallucinations and memory loss. Depression: Stable. Nervous/anxious: Stable. Insomnia: Stable.     Blood pressure 99/59, pulse 99, temperature 98.2 F (36.8 C), temperature source Oral, resp. rate 16, height  (1.549 m), weight 45.813 kg (101 lb), last menstrual period 12/17/2014, not currently breastfeeding.Body mass index is 19.09 kg/(m^2).    Past Medical History:  Past Medical History  Diagnosis Date  . Medical history non-contributory     Past Surgical History  Procedure Laterality Date  . No past surgeries     Family History:  Family History  Problem Relation Age of Onset  . Cancer Maternal Grandmother    Social History:  History  Alcohol Use No     History  Drug Use  . Yes  . Special: Benzodiazepines, Hydrocodone    Comment: xanax and percocets first time in one month    History   Social History  . Marital Status: Single    Spouse Name: N/A  . Number of Children: N/A  . Years of Education: N/A   Social History Main Topics  . Smoking status: Current Every Day Smoker -- 1.00 packs/day for 5 years    Types: Cigarettes  . Smokeless tobacco: Never Used  . Alcohol Use: No  . Drug Use: Yes    Special: Benzodiazepines, Hydrocodone     Comment: xanax and percocets first time in one month  . Sexual Activity: Not Currently    Birth Control/ Protection: None   Other Topics Concern  . None   Social History Narrative     Risk to Self:  Is patient at risk for suicide?: Yes What has been your use of drugs/alcohol within the last 12 months?: Patient reports abusing up to 100 mg of Roxy per day Risk to Others:   Prior Inpatient Therapy:   Prior Outpatient Therapy:    Level of Care:  OP  Hospital Course:  Lucendia HerrlichMaura S Pardy was admitted for Suicide attempt by drug ingestion and crisis management.  She was treated discharged with the medications listed below under Medication List.  Medical problems were identified and  treated as needed.  Home medications were restarted as appropriate.  Improvement was monitored by observation and Lucendia HerrlichMaura S Josephson daily report of symptom reduction.  Emotional and mental status was monitored by daily self-inventory reports completed by Lucendia HerrlichMaura S Brasington and clinical staff.         Mylinda LatinaMaura S Bronaugh was evaluated by the treatment team for stability and plans for continued recovery upon discharge.  Mylinda LatinaMaura S Alberson motivation was an integral factor for scheduling further treatment.  Employment, transportation, bed availability, health status, family support, and any pending legal issues were also considered during her hospital stay.  She was offered further treatment options upon discharge including but not limited to Residential, Intensive Outpatient, and Outpatient treatment.  Mylinda LatinaMaura S Mcclaran will follow up with the services as listed below under Follow Up Information.     Upon completion of this admission the patient was both mentally and medically stable for discharge denying suicidal/homicidal ideation, auditory/visual/tactile hallucinations, delusional thoughts and paranoia.      Consults:  psychiatry  Significant Diagnostic Studies:  labs: CBC/Diff, CMET, Urinalys,ETOH, UDS  Discharge Vitals:   Blood pressure 99/59, pulse 99, temperature 98.2 F (36.8 C), temperature source Oral, resp. rate 16, height 5\' 1"  (1.549 m), weight 45.813 kg (101 lb), last menstrual period 12/17/2014, not currently breastfeeding. Body mass index is 19.09 kg/(m^2). Lab Results:   Results for orders placed or performed during the hospital encounter of 12/29/14 (from the past 72 hour(s))  TSH     Status: None   Collection Time: 12/30/14  7:55 AM  Result Value Ref Range   TSH 0.926 0.350 - 4.500 uIU/mL    Comment[facility]    Physical Findings: AIMS: Facial and Oral Movements Muscles of Facial Expression: None, normal Lips and Perioral Area: None, normal Jaw: None,  normal Tongue: None, normal,Extremity Movements Upper (arms, wrists, hands, fingers): None, normal Lower (legs, knees, ankles, toes): None, normal, Trunk Movements Neck, shoulders, hips: None, normal, Overall Severity Severity of abnormal movements (highest score from questions above): None, normal Incapacitation due to abnormal movements: None, normal Patient's awareness of abnormal movements (rate only patient's report): No Awareness, Dental Status Current problems with teeth and/or dentures?: No Does patient usually wear dentures?: No  CIWA:  CIWA-Ar Total: 0 COWS:  COWS Total Score: 1   See Psychiatric Specialty Exam and Suicide Risk Assessment completed by Attending Physician prior to discharge.  Discharge destination:  Home  Is patient on multiple antipsychotic therapies at discharge:  No   Has Patient had three or more failed trials of antipsychotic monotherapy by history:  No    Recommended Plan for Multiple Antipsychotic Therapies: NA  Discharge Instructions    Activity as tolerated - No restrictions    Complete by:  As directed      Diet general    Complete by:  As directed      Discharge instructions    Complete by:  As directed  Take all of you medications as prescribed by your mental healthcare provider.  Report any adverse effects and reactions from your medications to your outpatient provider promptly. Do not engage in alcohol and or illegal drug use while on prescription medicines. In the event of worsening symptoms call the crisis hotline, 911, and or go to the nearest emergency department for appropriate evaluation and treatment of symptoms. Follow-up with your primary care provider for your medical issues, concerns and or health care needs.   Keep all scheduled appointments.  If you are unable to keep an appointment call to reschedule.  Let the nurse know if you will need medications before next scheduled appointment.            Medication List    STOP  taking these medications        ibuprofen 600 MG tablet  Commonly known as:  ADVIL,MOTRIN      TAKE these medications      Indication   hydrOXYzine 25 MG tablet  Commonly known as:  ATARAX/VISTARIL  Take 1 tablet (25 mg total) by mouth every 6 (six) hours as needed for anxiety.   Indication:  anxiety     traZODone 50 MG tablet  Commonly known as:  DESYREL  Take 1 tablet (50 mg total) by mouth at bedtime as needed for sleep.   Indication:  Trouble Sleeping           Follow-up Information    Follow up with [facility] On 01/05/2015.   Why:  You are scheduled with [facility] hospital discharge clinic on Monday, January 05, 2015 between 8 AM - 10:30 AM    Contact information:   [address] [place], [place]   [address]  [phone]      Follow-up recommendations:  Activity:  As tolerated Diet:  As tolerated  Comments:   Patient has been instructed to take medications as prescribed; and report adverse effects to outpatient provider.  Follow up with primary doctor for any medical issues and If symptoms recur report to nearest emergency or crisis hot line.    Total Discharge Time: Greater than 30 minutes  Signed: Assunta Found, FNP-BC 01/01/2015, 10:48 AM   Patient seen, Suicide Assessment Completed.  Disposition Plan Reviewed

## 2015-01-05 NOTE — Progress Notes (Signed)
Patient Discharge Instructions:  After Visit Summary (AVS):   Faxed to:  01/05/15 Discharge Summary Note:   Faxed to:  01/05/15 Psychiatric Admission Assessment Note:   Faxed to:  01/05/15 Suicide Risk Assessment - Discharge Assessment:   Faxed to:  01/05/15 Faxed/Sent to the Next Level Care provider:  01/05/15 [facility] [phone]  Jerelene ReddenSheena E [place], 01/05/2015, 3:47 PM

## 2015-01-21 ENCOUNTER — Ambulatory Visit: Payer: Medicaid Other | Admitting: Adult Health

## 2015-01-21 ENCOUNTER — Encounter: Payer: Self-pay | Admitting: Adult Health

## 2015-02-18 ENCOUNTER — Emergency Department (HOSPITAL_COMMUNITY)
Admission: EM | Admit: 2015-02-18 | Discharge: 2015-02-18 | Disposition: A | Discharge status: Home / Self Care | Payer: Medicaid Other | Attending: Emergency Medicine | Admitting: Emergency Medicine

## 2015-02-18 ENCOUNTER — Encounter (HOSPITAL_COMMUNITY): Payer: Self-pay | Admitting: Emergency Medicine

## 2015-02-18 ENCOUNTER — Emergency Department (HOSPITAL_COMMUNITY): Payer: Medicaid Other

## 2015-02-18 DIAGNOSIS — M5442 Lumbago with sciatica, left side: Secondary | ICD-10-CM

## 2015-02-18 DIAGNOSIS — M545 Low back pain: Secondary | ICD-10-CM | POA: Diagnosis present

## 2015-02-18 DIAGNOSIS — Z720 Tobacco use: Secondary | ICD-10-CM | POA: Diagnosis not present

## 2015-02-18 LAB — POC URINE PREG, ED: PREG TEST UR: NEGATIVE

## 2015-02-18 MED ORDER — IBUPROFEN 600 MG PO TABS: 600.0000 mg | ORAL_TABLET | Freq: Four times a day (QID) | ORAL | Status: DC | PRN

## 2015-02-18 MED ORDER — CYCLOBENZAPRINE HCL 10 MG PO TABS
10.0000 mg | ORAL_TABLET | Freq: Once | ORAL | Status: AC
Start: 2015-02-18 — End: 2015-02-18
  Administered 2015-02-18: 10 mg via ORAL
  Filled 2015-02-18: qty 1

## 2015-02-18 MED ORDER — TRAMADOL HCL 50 MG PO TABS
50.0000 mg | ORAL_TABLET | Freq: Once | ORAL | Status: AC
  Administered 2015-02-18: 50 mg via ORAL
  Filled 2015-02-18: qty 1

## 2015-02-18 MED ORDER — TRAMADOL HCL 50 MG PO TABS: 50.0000 mg | ORAL_TABLET | Freq: Four times a day (QID) | ORAL | Status: DC | PRN

## 2015-02-18 MED ORDER — IBUPROFEN 800 MG PO TABS
800.0000 mg | ORAL_TABLET | Freq: Once | ORAL | Status: AC
  Administered 2015-02-18: 800 mg via ORAL
  Filled 2015-02-18: qty 1

## 2015-02-18 MED ORDER — CYCLOBENZAPRINE HCL 5 MG PO TABS: 5.0000 mg | ORAL_TABLET | Freq: Three times a day (TID) | ORAL | Status: DC | PRN

## 2015-02-18 NOTE — ED Notes (Signed)
Pt reports left lower back pain ever since moving a couch and hearing a "pop". nad noted.

## 2015-02-18 NOTE — ED Notes (Signed)
Pt made aware to return if symptoms worsen or if any life threatening symptoms occur.   

## 2015-02-18 NOTE — Discharge Instructions (Signed)
Sciatica Sciatica is pain, weakness, numbness, or tingling along your sciatic nerve. The nerve starts in the lower back and runs down the back of each leg. Nerve damage or certain conditions pinch or put pressure on the sciatic nerve. This causes the pain, weakness, and other discomforts of sciatica. HOME CARE   Only take medicine as told by your doctor.  Apply ice to the affected area for 20 minutes. Do this 3-4 times a day for the first 48-72 hours. Then try heat in the same way.  Exercise, stretch, or do your usual activities if these do not make your pain worse.  Go to physical therapy as told by your doctor.  Keep all doctor visits as told.  Do not wear high heels or shoes that are not supportive.  Get a firm mattress if your mattress is too soft to lessen pain and discomfort. GET HELP RIGHT AWAY IF:   You cannot control when you poop (bowel movement) or pee (urinate).  You have more weakness in your lower back, lower belly (pelvis), butt (buttocks), or legs.  You have redness or puffiness (swelling) of your back.  You have a burning feeling when you pee.  You have pain that gets worse when you lie down.  You have pain that wakes you from your sleep.  Your pain is worse than past pain.  Your pain lasts longer than 4 weeks.  You are suddenly losing weight without reason. MAKE SURE YOU:   Understand these instructions.  Will watch this condition.  Will get help right away if you are not doing well or get worse. Document Released: 06/07/2008 Document Revised: 02/28/2012 Document Reviewed: 01/08/2012 [facility] Patient Information 2015 [employer] [place] [place]. This information is not intended to replace advice given to you by your health care provider. Make sure you discuss any questions you have with your health care provider.     Do not drive within 4 hours of taking tramadol or flexeril as these can make you drowsy.  Avoid lifting,  Bending,  Twisting or any other activity  that worsens your pain over the next week.  Apply an  icepack  to your lower back for 10-15 minutes every 2 hours for the next 2 days.  You should get rechecked if your symptoms are not better over the next 5 days,  Or you develop increased pain,  Weakness in your leg(s) or loss of bladder or bowel function - these are symptoms of a worse injury.

## 2015-02-19 NOTE — ED Provider Notes (Signed)
CSN: [id number]     Arrival date & time 02/18/15  1215 History   First MD Initiated Contact with Patient 02/18/15 1220     Chief Complaint  Patient presents with  . Back Pain     (Consider location/radiation/quality/duration/timing/severity/associated sxs/prior Treatment) The history is provided by the patient.   [name] is a 19 y.o. female presenting with left lower back pain which radiates into her left lateral mid thigh. Her symptoms started yesterday when she felt a popping sensation when moving a couch while housecleaning.  She endorses having pain after her epidural while in labor 10 months ago, but not again until yesterday. Her pain is worsened with movement.  She denies weakness or numbness in her legs and has had no urinary or fecal incontinence or retention. She has used ibuprofen without pain relief.  She is not breastfeeding.     History reviewed. No pertinent past medical history. History reviewed. No pertinent past surgical history. History reviewed. No pertinent family history. History  Substance Use Topics  . Smoking status: Current Every Day Smoker -- 0.50 packs/day  . Smokeless tobacco: Not on file  . Alcohol Use: No   OB History    No data available     Review of Systems  Constitutional: Negative for fever.  Respiratory: Negative for shortness of breath.   Cardiovascular: Negative for chest pain and leg swelling.  Gastrointestinal: Negative for abdominal pain, constipation and abdominal distention.  Genitourinary: Negative for dysuria, urgency, frequency, flank pain and difficulty urinating.  Musculoskeletal: Positive for back pain. Negative for joint swelling and gait problem.  Skin: Negative for rash.  Neurological: Negative for weakness and numbness.      Allergies  Review of patient's allergies indicates no known allergies.  Home Medications   Prior to Admission medications   Medication Sig Start Date End Date Taking? Authorizing Provider   cyclobenzaprine (FLEXERIL) 5 MG tablet Take 1 tablet (5 mg total) by mouth 3 (three) times daily as needed for muscle spasms. 02/18/15   Burgess Amor, PA-C  ibuprofen (ADVIL,MOTRIN) 600 MG tablet Take 1 tablet (600 mg total) by mouth every 6 (six) hours as needed. 02/18/15   Burgess Amor, PA-C  traMADol (ULTRAM) 50 MG tablet Take 1 tablet (50 mg total) by mouth every 6 (six) hours as needed. 02/18/15   Burgess Amor, PA-C   BP 130/64 mmHg  Pulse 86  Temp(Src) 98.2 F (36.8 C) (Oral)  Resp 18  Ht 5' (1.524 m)  Wt 102 lb (46.267 kg)  BMI 19.92 kg/m2  SpO2 100%  LMP 01/26/2015 Physical Exam  Constitutional: She appears well-developed and well-nourished.  HENT:  Head: Normocephalic.  Eyes: Conjunctivae are normal.  Neck: Normal range of motion. Neck supple.  Cardiovascular: Normal rate and intact distal pulses.   Pedal pulses normal.  Pulmonary/Chest: Effort normal.  Abdominal: Soft. Bowel sounds are normal. She exhibits no distension and no mass.  Musculoskeletal: Normal range of motion. She exhibits no edema.       Lumbar back: She exhibits tenderness. She exhibits no swelling, no edema and no spasm.  ttp left para lumbar region. No midline pain.   Neurological: She is alert. She has normal strength. She displays no atrophy and no tremor. No sensory deficit. Gait normal.  Reflex Scores:      Patellar reflexes are 2+ on the right side and 2+ on the left side.      Achilles reflexes are 2+ on the right side and 2+ on the  left side. No strength deficit noted in hip and knee flexor and extensor muscle groups.  Ankle flexion and extension intact.  Skin: Skin is warm and dry.  Psychiatric: She has a normal mood and affect.  Nursing note and vitals reviewed.   ED Course  Procedures (including critical care time) Labs Review Labs Reviewed  POC URINE PREG, ED    Imaging Review Dg Lumbar Spine Complete  02/18/2015   CLINICAL DATA:  19 year old female with lumbar back pain radiating to the left  lower extremity, acute onset with no known injury. Initial encounter.  EXAM: LUMBAR SPINE - COMPLETE 4+ VIEW  COMPARISON:  None.  FINDINGS: Normal lumbar segmentation. Bone mineralization is within normal limits. Normal vertebral height and alignment. Preserved disc spaces. No pars fracture. Sacral ala and SI joints appear within normal limits. Visible lower thoracic levels appear unremarkable.  IMPRESSION: Negative radiographic appearance of the lumbar spine.   Electronically Signed   By: Odessa Fleming M.D.   On: 02/18/2015 13:30     EKG Interpretation None      MDM   Final diagnoses:  Left-sided low back pain with left-sided sciatica    Patients labs and/or radiological studies were reviewed and considered during the medical decision making and disposition process.  Results were also discussed with patient. Pt was placed on flexeril, ibuprofen, tramadol, advised heat, activity as tolerated.  No neuro deficit on exam or by history to suggest emergent or surgical presentation.  Also discussed worsened sx that should prompt immediate re-evaluation including distal weakness, bowel/bladder retention/incontinence.          Burgess Amor, PA-C 02/19/15 [id number]  Gilda Crease, MD 02/19/15 [phone]

## 2015-02-20 ENCOUNTER — Inpatient Hospital Stay (HOSPITAL_COMMUNITY)
Admission: EM | Admit: 2015-02-20 | Discharge: 2015-02-26 | DRG: 871 | Disposition: A | Discharge status: Home / Self Care | Payer: Medicaid Other | Attending: Internal Medicine | Admitting: Internal Medicine

## 2015-02-20 DIAGNOSIS — R0902 Hypoxemia: Secondary | ICD-10-CM

## 2015-02-20 DIAGNOSIS — J9601 Acute respiratory failure with hypoxia: Secondary | ICD-10-CM | POA: Diagnosis present

## 2015-02-20 DIAGNOSIS — N10 Acute tubulo-interstitial nephritis: Secondary | ICD-10-CM | POA: Diagnosis present

## 2015-02-20 DIAGNOSIS — R6521 Severe sepsis with septic shock: Secondary | ICD-10-CM | POA: Diagnosis present

## 2015-02-20 DIAGNOSIS — A4151 Sepsis due to Escherichia coli [E. coli]: Principal | ICD-10-CM | POA: Diagnosis present

## 2015-02-20 DIAGNOSIS — A419 Sepsis, unspecified organism: Secondary | ICD-10-CM

## 2015-02-20 DIAGNOSIS — J90 Pleural effusion, not elsewhere classified: Secondary | ICD-10-CM | POA: Diagnosis not present

## 2015-02-20 DIAGNOSIS — B9620 Unspecified Escherichia coli [E. coli] as the cause of diseases classified elsewhere: Secondary | ICD-10-CM | POA: Diagnosis present

## 2015-02-20 DIAGNOSIS — Y95 Nosocomial condition: Secondary | ICD-10-CM | POA: Diagnosis present

## 2015-02-20 DIAGNOSIS — E876 Hypokalemia: Secondary | ICD-10-CM | POA: Diagnosis present

## 2015-02-20 DIAGNOSIS — F17210 Nicotine dependence, cigarettes, uncomplicated: Secondary | ICD-10-CM | POA: Diagnosis present

## 2015-02-20 DIAGNOSIS — J189 Pneumonia, unspecified organism: Secondary | ICD-10-CM | POA: Diagnosis present

## 2015-02-20 DIAGNOSIS — R1084 Generalized abdominal pain: Secondary | ICD-10-CM | POA: Diagnosis present

## 2015-02-20 DIAGNOSIS — N390 Urinary tract infection, site not specified: Secondary | ICD-10-CM | POA: Diagnosis present

## 2015-02-20 DIAGNOSIS — D649 Anemia, unspecified: Secondary | ICD-10-CM | POA: Diagnosis present

## 2015-02-21 ENCOUNTER — Emergency Department (HOSPITAL_COMMUNITY): Payer: Medicaid Other

## 2015-02-21 ENCOUNTER — Encounter (HOSPITAL_COMMUNITY): Payer: Self-pay | Admitting: *Deleted

## 2015-02-21 ENCOUNTER — Inpatient Hospital Stay (HOSPITAL_COMMUNITY): Payer: Medicaid Other

## 2015-02-21 DIAGNOSIS — R6521 Severe sepsis with septic shock: Secondary | ICD-10-CM | POA: Diagnosis not present

## 2015-02-21 DIAGNOSIS — F17210 Nicotine dependence, cigarettes, uncomplicated: Secondary | ICD-10-CM | POA: Diagnosis present

## 2015-02-21 DIAGNOSIS — J9601 Acute respiratory failure with hypoxia: Secondary | ICD-10-CM | POA: Diagnosis present

## 2015-02-21 DIAGNOSIS — N390 Urinary tract infection, site not specified: Secondary | ICD-10-CM | POA: Diagnosis not present

## 2015-02-21 DIAGNOSIS — R0902 Hypoxemia: Secondary | ICD-10-CM | POA: Diagnosis not present

## 2015-02-21 DIAGNOSIS — A4151 Sepsis due to Escherichia coli [E. coli]: Secondary | ICD-10-CM

## 2015-02-21 DIAGNOSIS — R1084 Generalized abdominal pain: Secondary | ICD-10-CM | POA: Diagnosis not present

## 2015-02-21 DIAGNOSIS — E876 Hypokalemia: Secondary | ICD-10-CM | POA: Diagnosis not present

## 2015-02-21 DIAGNOSIS — N10 Acute tubulo-interstitial nephritis: Secondary | ICD-10-CM | POA: Diagnosis not present

## 2015-02-21 DIAGNOSIS — Y95 Nosocomial condition: Secondary | ICD-10-CM | POA: Diagnosis present

## 2015-02-21 DIAGNOSIS — D649 Anemia, unspecified: Secondary | ICD-10-CM | POA: Diagnosis present

## 2015-02-21 DIAGNOSIS — J189 Pneumonia, unspecified organism: Secondary | ICD-10-CM | POA: Diagnosis not present

## 2015-02-21 DIAGNOSIS — B9620 Unspecified Escherichia coli [E. coli] as the cause of diseases classified elsewhere: Secondary | ICD-10-CM | POA: Diagnosis present

## 2015-02-21 DIAGNOSIS — N12 Tubulo-interstitial nephritis, not specified as acute or chronic: Secondary | ICD-10-CM

## 2015-02-21 DIAGNOSIS — A419 Sepsis, unspecified organism: Secondary | ICD-10-CM | POA: Diagnosis not present

## 2015-02-21 DIAGNOSIS — R5381 Other malaise: Secondary | ICD-10-CM | POA: Diagnosis not present

## 2015-02-21 HISTORY — DX: Sepsis due to Escherichia coli (e. coli): A41.51

## 2015-02-21 HISTORY — DX: Tubulo-interstitial nephritis, not specified as acute or chronic: N12

## 2015-02-21 LAB — CBC WITH DIFFERENTIAL/PLATELET
BASOS ABS: 0 10*3/uL (ref 0.0–0.1)
Basophils Absolute: 0 10*3/uL (ref 0.0–0.1)
Basophils Relative: 0 % (ref 0–1)
Basophils Relative: 0 % (ref 0–1)
EOS ABS: 0 10*3/uL (ref 0.0–0.7)
Eosinophils Absolute: 0 10*3/uL (ref 0.0–0.7)
Eosinophils Relative: 0 % (ref 0–5)
Eosinophils Relative: 0 % (ref 0–5)
HCT: 38.9 % (ref 36.0–46.0)
HEMATOCRIT: 33.1 % — AB (ref 36.0–46.0)
HEMOGLOBIN: 10.7 g/dL — AB (ref 12.0–15.0)
Hemoglobin: 12.9 g/dL (ref 12.0–15.0)
LYMPHS ABS: 1.3 10*3/uL (ref 0.7–4.0)
LYMPHS ABS: 1.6 10*3/uL (ref 0.7–4.0)
Lymphocytes Relative: 4 % — ABNORMAL LOW (ref 12–46)
Lymphocytes Relative: 6 % — ABNORMAL LOW (ref 12–46)
MCH: 31.1 pg (ref 26.0–34.0)
MCH: 31.7 pg (ref 26.0–34.0)
MCHC: 32.3 g/dL (ref 30.0–36.0)
MCHC: 33.2 g/dL (ref 30.0–36.0)
MCV: 95.6 fL (ref 78.0–100.0)
MCV: 96.2 fL (ref 78.0–100.0)
MONOS PCT: 3 % (ref 3–12)
Monocytes Absolute: 1 10*3/uL (ref 0.1–1.0)
Monocytes Absolute: 3.4 10*3/uL — ABNORMAL HIGH (ref 0.1–1.0)
Monocytes Relative: 12 % (ref 3–12)
NEUTROS ABS: 23.3 10*3/uL — AB (ref 1.7–7.7)
NEUTROS PCT: 82 % — AB (ref 43–77)
Neutro Abs: 27.4 10*3/uL — ABNORMAL HIGH (ref 1.7–7.7)
Neutrophils Relative %: 92 % — ABNORMAL HIGH (ref 43–77)
PLATELETS: 242 10*3/uL (ref 150–400)
Platelets: 305 10*3/uL (ref 150–400)
RBC: 4.07 MIL/uL (ref 3.87–5.11)
RBC: 3.44 MIL/uL — ABNORMAL LOW (ref 3.87–5.11)
RDW: 13.3 % (ref 11.5–15.5)
RDW: 13.4 % (ref 11.5–15.5)
WBC: 29.6 10*3/uL — ABNORMAL HIGH (ref 4.0–10.5)
WBC: 28.3 10*3/uL — ABNORMAL HIGH (ref 4.0–10.5)

## 2015-02-21 LAB — I-STAT CG4 LACTIC ACID, ED: Lactic Acid, Venous: 2.6 mmol/L (ref 0.5–2.0)

## 2015-02-21 LAB — URINALYSIS, ROUTINE W REFLEX MICROSCOPIC
BILIRUBIN URINE: NEGATIVE
Glucose, UA: NEGATIVE mg/dL
NITRITE: POSITIVE — AB
PH: 5.5 (ref 5.0–8.0)
Protein, ur: 30 mg/dL — AB
Specific Gravity, Urine: 1.02 (ref 1.005–1.030)
Urobilinogen, UA: 0.2 mg/dL (ref 0.0–1.0)

## 2015-02-21 LAB — URINE MICROSCOPIC-ADD ON

## 2015-02-21 LAB — COMPREHENSIVE METABOLIC PANEL
ALK PHOS: 45 U/L (ref 38–126)
ALT: 13 U/L — ABNORMAL LOW (ref 14–54)
ALT: 11 U/L — ABNORMAL LOW (ref 14–54)
AST: 18 U/L (ref 15–41)
AST: 13 U/L — ABNORMAL LOW (ref 15–41)
Albumin: 3.7 g/dL (ref 3.5–5.0)
Albumin: 2.6 g/dL — ABNORMAL LOW (ref 3.5–5.0)
Alkaline Phosphatase: 53 U/L (ref 38–126)
Anion gap: 13 (ref 5–15)
Anion gap: 9 (ref 5–15)
BILIRUBIN TOTAL: 0.6 mg/dL (ref 0.3–1.2)
BILIRUBIN TOTAL: 0.9 mg/dL (ref 0.3–1.2)
BUN: 14 mg/dL (ref 6–20)
BUN: 13 mg/dL (ref 6–20)
CHLORIDE: 103 mmol/L (ref 101–111)
CO2: 22 mmol/L (ref 22–32)
CO2: 21 mmol/L — ABNORMAL LOW (ref 22–32)
CREATININE: 0.61 mg/dL (ref 0.44–1.00)
CREATININE: 0.74 mg/dL (ref 0.44–1.00)
Calcium: 8.9 mg/dL (ref 8.9–10.3)
Calcium: 7.2 mg/dL — ABNORMAL LOW (ref 8.9–10.3)
Chloride: 109 mmol/L (ref 101–111)
GFR calc Af Amer: >60 mL/min (ref >60)
GFR calc non Af Amer: >60 mL/min (ref >60)
Glucose, Bld: 95 mg/dL (ref 65–99)
Glucose, Bld: 110 mg/dL — ABNORMAL HIGH (ref 65–99)
POTASSIUM: 3.7 mmol/L (ref 3.5–5.1)
Potassium: 3.3 mmol/L — ABNORMAL LOW (ref 3.5–5.1)
Sodium: 138 mmol/L (ref 135–145)
Sodium: 139 mmol/L (ref 135–145)
TOTAL PROTEIN: 5.3 g/dL — AB (ref 6.5–8.1)
TOTAL PROTEIN: 7.3 g/dL (ref 6.5–8.1)

## 2015-02-21 LAB — LACTIC ACID, PLASMA
Lactic Acid, Venous: 0.7 mmol/L (ref 0.5–2.0)
Lactic Acid, Venous: 1.3 mmol/L (ref 0.5–2.0)

## 2015-02-21 LAB — APTT: APTT: 37 s (ref 24–37)

## 2015-02-21 LAB — MRSA PCR SCREENING: MRSA by PCR: NEGATIVE

## 2015-02-21 LAB — PROTIME-INR
INR: 1.34 (ref 0.00–1.49)
Prothrombin Time: 16.7 seconds — ABNORMAL HIGH (ref 11.6–15.2)

## 2015-02-21 LAB — RAPID URINE DRUG SCREEN, HOSP PERFORMED
Amphetamines: NOT DETECTED
BARBITURATES: NOT DETECTED
Benzodiazepines: NOT DETECTED
COCAINE: NOT DETECTED
Opiates: POSITIVE — AB
Tetrahydrocannabinol: NOT DETECTED

## 2015-02-21 LAB — PREGNANCY, URINE: Preg Test, Ur: NEGATIVE

## 2015-02-21 LAB — CORTISOL: CORTISOL PLASMA: 25.2 ug/dL

## 2015-02-21 LAB — PROCALCITONIN: Procalcitonin: 1.84 ng/mL

## 2015-02-21 MED ORDER — SODIUM CHLORIDE 0.9 % IV SOLN: 1000.0000 mL | INTRAVENOUS | Status: DC

## 2015-02-21 MED ORDER — DEXTROSE 5 % IV SOLN
1.0000 g | Freq: Once | INTRAVENOUS | Status: AC
  Administered 2015-02-21: 1 g via INTRAVENOUS

## 2015-02-21 MED ORDER — SODIUM CHLORIDE 0.9 % IV BOLUS (SEPSIS)
1000.0000 mL | Freq: Once | INTRAVENOUS | Status: AC
  Administered 2015-02-21: 1000 mL via INTRAVENOUS

## 2015-02-21 MED ORDER — DEXTROSE 5 % IV SOLN
1.0000 g | Freq: Once | INTRAVENOUS | Status: AC
  Administered 2015-02-21: 1 g via INTRAVENOUS
  Filled 2015-02-21: qty 10

## 2015-02-21 MED ORDER — KETOROLAC TROMETHAMINE 30 MG/ML IJ SOLN
30.0000 mg | Freq: Four times a day (QID) | INTRAMUSCULAR | Status: DC | PRN
  Administered 2015-02-21 – 2015-02-24 (×8): 30 mg via INTRAVENOUS
  Filled 2015-02-21 (×9): qty 1

## 2015-02-21 MED ORDER — HEPARIN SODIUM (PORCINE) 5000 UNIT/ML IJ SOLN
5000.0000 [IU] | Freq: Three times a day (TID) | INTRAMUSCULAR | Status: DC
  Administered 2015-02-21 – 2015-02-25 (×9): 5000 [IU] via SUBCUTANEOUS
  Filled 2015-02-21 (×12): qty 1

## 2015-02-21 MED ORDER — SODIUM CHLORIDE 0.9 % IV BOLUS (SEPSIS)
500.0000 mL | INTRAVENOUS | Status: AC
  Administered 2015-02-21: 500 mL via INTRAVENOUS

## 2015-02-21 MED ORDER — NICOTINE 14 MG/24HR TD PT24
14.0000 mg | MEDICATED_PATCH | Freq: Every day | TRANSDERMAL | Status: DC
  Administered 2015-02-21 – 2015-02-25 (×5): 14 mg via TRANSDERMAL
  Filled 2015-02-21 (×5): qty 1

## 2015-02-21 MED ORDER — VANCOMYCIN HCL IN DEXTROSE 1-5 GM/200ML-% IV SOLN
1000.0000 mg | Freq: Once | INTRAVENOUS | Status: DC
  Filled 2015-02-21: qty 200

## 2015-02-21 MED ORDER — KETOROLAC TROMETHAMINE 30 MG/ML IJ SOLN
30.0000 mg | Freq: Once | INTRAMUSCULAR | Status: AC
  Administered 2015-02-21: 30 mg via INTRAVENOUS
  Filled 2015-02-21: qty 1

## 2015-02-21 MED ORDER — OXYCODONE-ACETAMINOPHEN 5-325 MG PO TABS
1.0000 | ORAL_TABLET | ORAL | Status: DC | PRN
  Administered 2015-02-21 – 2015-02-26 (×23): 2 via ORAL
  Filled 2015-02-21: qty 1
  Filled 2015-02-21 (×24): qty 2

## 2015-02-21 MED ORDER — VANCOMYCIN HCL 500 MG IV SOLR
500.0000 mg | Freq: Three times a day (TID) | INTRAVENOUS | Status: DC
  Administered 2015-02-21 – 2015-02-23 (×5): 500 mg via INTRAVENOUS
  Filled 2015-02-21 (×6): qty 500

## 2015-02-21 MED ORDER — SODIUM CHLORIDE 0.9 % IV BOLUS (SEPSIS): 500.0000 mL | Freq: Once | INTRAVENOUS | Status: DC

## 2015-02-21 MED ORDER — ONDANSETRON HCL 4 MG PO TABS: 4.0000 mg | ORAL_TABLET | Freq: Four times a day (QID) | ORAL | Status: DC | PRN

## 2015-02-21 MED ORDER — OXYCODONE-ACETAMINOPHEN 5-325 MG PO TABS
1.0000 | ORAL_TABLET | Freq: Four times a day (QID) | ORAL | Status: DC | PRN
  Administered 2015-02-21 (×2): 1 via ORAL
  Filled 2015-02-21 (×2): qty 1

## 2015-02-21 MED ORDER — DEXTROSE 5 % IV SOLN
INTRAVENOUS | Status: AC
  Filled 2015-02-21: qty 10

## 2015-02-21 MED ORDER — SODIUM CHLORIDE 0.9 % IV SOLN
1000.0000 mL | Freq: Once | INTRAVENOUS | Status: AC
  Administered 2015-02-21: 1000 mL via INTRAVENOUS

## 2015-02-21 MED ORDER — ONDANSETRON HCL 4 MG/2ML IJ SOLN
4.0000 mg | Freq: Four times a day (QID) | INTRAMUSCULAR | Status: DC | PRN
  Administered 2015-02-21 – 2015-02-24 (×3): 4 mg via INTRAVENOUS
  Filled 2015-02-21 (×3): qty 2

## 2015-02-21 MED ORDER — IOHEXOL 300 MG/ML  SOLN
50.0000 mL | Freq: Once | INTRAMUSCULAR | Status: AC | PRN
  Administered 2015-02-21: 50 mL via ORAL

## 2015-02-21 MED ORDER — DEXTROSE 5 % IV SOLN: 2.0000 g | Freq: Once | INTRAVENOUS | Status: DC

## 2015-02-21 MED ORDER — DEXTROSE 5 % IV SOLN
1.0000 g | INTRAVENOUS | Status: DC
  Administered 2015-02-21 – 2015-02-22 (×2): 1 g via INTRAVENOUS
  Filled 2015-02-21 (×3): qty 10

## 2015-02-21 MED ORDER — SODIUM CHLORIDE 0.9 % IV SOLN
INTRAVENOUS | Status: DC
  Administered 2015-02-21 (×2): via INTRAVENOUS

## 2015-02-21 MED ORDER — ACETAMINOPHEN 325 MG PO TABS
650.0000 mg | ORAL_TABLET | Freq: Once | ORAL | Status: AC
  Administered 2015-02-21: 650 mg via ORAL
  Filled 2015-02-21: qty 2

## 2015-02-21 MED ORDER — SODIUM CHLORIDE 0.9 % IV BOLUS (SEPSIS)
500.0000 mL | Freq: Once | INTRAVENOUS | Status: AC
  Administered 2015-02-21: 500 mL via INTRAVENOUS

## 2015-02-21 MED ORDER — VANCOMYCIN HCL IN DEXTROSE 1-5 GM/200ML-% IV SOLN
1000.0000 mg | Freq: Once | INTRAVENOUS | Status: AC
  Administered 2015-02-21: 1000 mg via INTRAVENOUS
  Filled 2015-02-21: qty 200

## 2015-02-21 MED ORDER — IOHEXOL 300 MG/ML  SOLN
100.0000 mL | Freq: Once | INTRAMUSCULAR | Status: AC | PRN
  Administered 2015-02-21: 100 mL via INTRAVENOUS

## 2015-02-21 MED ORDER — CEFTRIAXONE SODIUM IN DEXTROSE 20 MG/ML IV SOLN
1.0000 g | INTRAVENOUS | Status: DC
  Filled 2015-02-21 (×2): qty 50

## 2015-02-21 NOTE — Progress Notes (Signed)
eLink Physician-Brief Progress Note Patient Name: [name] DOB: 1996-01-12 MRN: [id number]   Date of Service  02/21/2015  HPI/Events of Note  Continued hypotension in patient admitted with pyelonephritis.  Patient is drowsy but responsive. No resp distress.   Tachycardiac at 135 with BP of 103/64 (72).  Lactate elevated has now normalized.  CT shows findings consistent with pyelo.  eICU Interventions  Plan: Continue current treatment Additional 500 cc NS bolus for ongoing tachycardia.      Intervention Category Intermediate Interventions: Hypotension - evaluation and management  DETERDING,ELIZABETH 02/21/2015, 6:50 AM

## 2015-02-21 NOTE — Progress Notes (Signed)
Patient voiced concerns regarding leaving AMA. Myself and Dr. Conley Rolls in to talk with patient regarding recommendation to stay and risks of leaving. New pain medication and nicotine patient ordered for patient comfort interventions per Dr. Conley Rolls. Encouraged patient and fiance to ambulate around unit. Patient staying for now. Will continue to monitor closely.

## 2015-02-21 NOTE — ED Notes (Signed)
Pt reports chills started yesterday & has gotten worse tonight. Pt activity vomiting in room.

## 2015-02-21 NOTE — Progress Notes (Signed)
Follow up from earlier admission this morning. 19 yo previously healthy, with one child 50 mon old, lives with fiance and the in laws, admitted for septic shock, from UTI, and pyelo. Initially presented with marked leukocytosis, hypotensive and tachycardia.  Her WBC was 30K, BP was in the 70's and HR 140.  She was given IV Rocephin, and BC was done.  She had 5 L of IVF, and her BP is a little bit better, varying beween 120 and 100 systolic.  Preg test negative.  Since she is young, she can probably tolerate more fluid, will give more IVF.   Given the severity of her illness, will add Zenaida Niece to Rocephin, to cover potential resistant urinary pathogens.  She doesn't want her primary family to know she is ill, as I offered to call them.  She indicated that if she cannot make decision for herself, then she wishes to have her Fiance to be her HCP.  She is ill and I made her aware of it. Will keep her in the ICU.

## 2015-02-21 NOTE — Progress Notes (Signed)
ANTIBIOTIC CONSULT NOTE-Preliminary  Pharmacy Consult for ceftriaxone Indication: urosepsis  No Known Allergies  Patient Measurements: Height: 5' (152.4 cm) Weight: 102 lb (46.267 kg) IBW/kg (Calculated) : 45.5   Vital Signs: Temp: 99.3 F (37.4 C) (06/11 0229) Temp Source: Oral (06/11 0229) BP: 92/56 mmHg (06/11 0230) Pulse Rate: 118 (06/11 0230)  Labs:  Recent Labs  02/21/15 0014  WBC 29.6*  HGB 12.9  PLT 305  CREATININE 0.74    Estimated Creatinine Clearance: 81.2 mL/min (by C-G formula based on Cr of 0.74).  No results for input(s): VANCOTROUGH, VANCOPEAK, VANCORANDOM, GENTTROUGH, GENTPEAK, GENTRANDOM, TOBRATROUGH, TOBRAPEAK, TOBRARND, AMIKACINPEAK, AMIKACINTROU, AMIKACIN in the last 72 hours.   Microbiology: Recent Results (from the past 720 hour(s))  Blood culture (routine x 2)     Status: None (Preliminary result)   Collection Time: 02/21/15 12:21 AM  Result Value Ref Range Status   Specimen Description LEFT ANTECUBITAL  Final   Special Requests BOTTLES DRAWN AEROBIC AND ANAEROBIC [facility]  Final   Culture PENDING  Incomplete   Report Status PENDING  Incomplete  Blood culture (routine x 2)     Status: None (Preliminary result)   Collection Time: 02/21/15 12:27 AM  Result Value Ref Range Status   Specimen Description BLOOD RIGHT ARM  Final   Special Requests BOTTLES DRAWN AEROBIC AND ANAEROBIC [facility]  Final   Culture PENDING  Incomplete   Report Status PENDING  Incomplete    Medical History: History reviewed. No pertinent past medical history.  Medications:  Scheduled:  . heparin  5,000 Units Subcutaneous 3 times per day    Assessment: 19 yo presented with fever, chills and L side back pain. She was hypotensive ad tachycardic. Lactate mildly elevated, WBC elevated at 29.6. +UA and suspected pyelonephritis. Was treated previously with Rocephin and will treat again with Rocephin.   Goal of Therapy:  eradication of infection  Plan:  Preliminary review of  pertinent patient information completed.  Protocol will be initiated with a one-time dose(s) of ceftriaxone 1 gram to be given after the 1 gram that was given in the ED to total ceftriaxone 2 grams.  Jeani Hawking clinical pharmacist will complete review during morning rounds to assess patient and finalize treatment regimen.  Loyola Mast, [facility] 02/21/2015,3:06 AM

## 2015-02-21 NOTE — H&P (Signed)
Hospitalist Admission History and Physical  Patient name: [name] Medical record number: [id number] Date of birth: [birth year] Age: 19 y.o. Gender: female  Primary Care Provider: No PCP Per Patient  Chief Complaint: sepsis, UTI   History of Present Illness:This is a 19 y.o. year old female with no significant past medical history presenting with sepsis, UTI. Pt reports back pain over the past 2-3 days. + generalized malaise. Mild nausea. Denies any dysuria or increased urinary frequency. Was seen in the ER 1-2 days ago. Diagnosed w/ back pain. Denies any significant prior medical history.  Presented to ER Tmax 102.8, HR 110s-160s, resp 10s-20s, BP 80s-90s, satting upper 90s on RA.  WBC 29.6. Cr 0.74. Urinalysis indicative of infection. Lactate 2.6.   Assessment and Plan: Sheryn Aldaz is a 19 y.o. year old female presenting with sepsis, UTI   Active Problems:   Sepsis   UTI (urinary tract infection)   1- Sepsis -meets criteria by HR, T, WBC -noted lactate 2.6  -Likely secondary to urinary source -IV Rocephin -IVFs -ICU bed   2-UTI  -UA concerning for UTI -CT abd and pelvis to eval for pyelo/upper tract disease  -urine culture  -IV  Rocephin   FEN/GI: regular diet  Prophylaxis: sub q heparin  Disposition: pending further evaluation  Code Status:Full Code    Patient Active Problem List   Diagnosis Date Noted  . Sepsis 02/21/2015   Past Medical History: History reviewed. No pertinent past medical history.  Past Surgical History: History reviewed. No pertinent past surgical history.  Social History: History   Social History  . Marital Status: Single    Spouse Name: N/A  . Number of Children: N/A  . Years of Education: N/A   Social History Main Topics  . Smoking status: Current Every Day Smoker -- 0.50 packs/day  . Smokeless tobacco: Not on file  . Alcohol Use: No  . Drug Use: No  . Sexual Activity: Yes    Birth Control/ Protection: None   Other Topics  Concern  . None   Social History Narrative    Family History: No family history on file.  Allergies: No Known Allergies  Current Facility-Administered Medications  Medication Dose Route Frequency Provider Last Rate Last Dose  . 0.9 %  sodium chloride infusion  1,000 mL Intravenous Continuous Geoffery Lyons, MD      . 0.9 %  sodium chloride infusion   Intravenous Continuous Floydene Flock, MD      . cefTRIAXone (ROCEPHIN) 1 g in dextrose 5 % 50 mL IVPB  1 g Intravenous Once Geoffery Lyons, MD 100 mL/hr at 02/21/15 0227 1 g at 02/21/15 0227  . cefTRIAXone (ROCEPHIN) 2 g in dextrose 5 % 50 mL IVPB  2 g Intravenous Once Floydene Flock, MD      . heparin injection 5,000 Units  5,000 Units Subcutaneous 3 times per day Floydene Flock, MD      . ondansetron [facility]) tablet 4 mg  4 mg Oral Q6H PRN Floydene Flock, MD       Or  . ondansetron [facility]) injection 4 mg  4 mg Intravenous Q6H PRN Floydene Flock, MD      . sodium chloride 0.9 % bolus 1,000 mL  1,000 mL Intravenous Once Floydene Flock, MD       Followed by  . sodium chloride 0.9 % bolus 500 mL  500 mL Intravenous Q1H Floydene Flock, MD       Current Outpatient  Prescriptions  Medication Sig Dispense Refill  . cyclobenzaprine (FLEXERIL) 5 MG tablet Take 1 tablet (5 mg total) by mouth 3 (three) times daily as needed for muscle spasms. 30 tablet 0  . ibuprofen (ADVIL,MOTRIN) 600 MG tablet Take 1 tablet (600 mg total) by mouth every 6 (six) hours as needed. 30 tablet 0  . traMADol (ULTRAM) 50 MG tablet Take 1 tablet (50 mg total) by mouth every 6 (six) hours as needed. 15 tablet 0   Review Of Systems: Complete ROS negative except as noted above in HPI.  Physical Exam: Filed Vitals:   02/21/15 0229  BP:   Pulse:   Temp: 99.3 F (37.4 C)  Resp:     General: alert and cooperative HEENT: PERRLA and extra ocular movement intact Heart: S1, S2 normal, no murmur, rub or gallop, regular rate and rhythm Lungs: clear to auscultation,  no wheezes or rales and unlabored breathing Abdomen:+ bowel sounds, abd warm to touch, moderate TTP diffusely Extremities: extremities normal, atraumatic, no cyanosis or edema Skin:no rashes Neurology: normal without focal findings Psych: mood stable    Labs and Imaging: Lab Results  Component Value Date/Time   NA 138 02/21/2015 12:14 AM   K 3.7 02/21/2015 12:14 AM   CL 103 02/21/2015 12:14 AM   CO2 22 02/21/2015 12:14 AM   BUN 14 02/21/2015 12:14 AM   CREATININE 0.74 02/21/2015 12:14 AM   GLUCOSE 95 02/21/2015 12:14 AM   Lab Results  Component Value Date   WBC 29.6* 02/21/2015   HGB 12.9 02/21/2015   HCT 38.9 02/21/2015   MCV 95.6 02/21/2015   PLT 305 02/21/2015    Dg Chest 2 View  02/21/2015   CLINICAL DATA:  Acute onset of fever and chills. Vomiting and severe headache. Back pain for 4 days. Initial encounter.  EXAM: CHEST  2 VIEW  COMPARISON:  None.  FINDINGS: The lungs are well-aerated and clear. There is no evidence of focal opacification, pleural effusion or pneumothorax.  The heart is normal in size; the mediastinal contour is within normal limits. No acute osseous abnormalities are seen.  IMPRESSION: No acute cardiopulmonary process seen.   Electronically Signed   By: Roanna Raider M.D.   On: 02/21/2015 01:38        Greater than 50% of greater than 70 minutes spent with patient in terms of direct patient care and/or care coordination.   Imaging studies and EKG readings personally reviewed    Doree Albee MD  Pager: [phone]

## 2015-02-21 NOTE — Progress Notes (Signed)
ANTIBIOTIC CONSULT NOTE - INITIAL  Pharmacy Consult for Vancomycin & Rocephin Indication: urosepsis  No Known Allergies  Patient Measurements: Height: 5\' 4"  (162.6 cm) Weight: 109 lb 2 oz (49.5 kg) IBW/kg (Calculated) : 54.7 Adjusted Body Weight:   Vital Signs: Temp: 100.4 F (38 C) (06/11 0756) Temp Source: Axillary (06/11 0756) BP: 111/68 mmHg (06/11 0715) Pulse Rate: 132 (06/11 0715) Intake/Output from previous day: 06/10 0701 - 06/11 0700 In: 4876.3 [P.O.:520; I.V.:2256.3; IV Piggyback:2100] Out: 700 [Urine:700] Intake/Output from this shift:    Labs:  Recent Labs  02/21/15 0014 02/21/15 0356  WBC 29.6* 28.3*  HGB 12.9 10.7*  PLT 305 242  CREATININE 0.74 0.61   Estimated Creatinine Clearance: 88.4 mL/min (by C-G formula based on Cr of 0.61). No results for input(s): VANCOTROUGH, VANCOPEAK, VANCORANDOM, GENTTROUGH, GENTPEAK, GENTRANDOM, TOBRATROUGH, TOBRAPEAK, TOBRARND, AMIKACINPEAK, AMIKACINTROU, AMIKACIN in the last 72 hours.   Microbiology: Recent Results (from the past 720 hour(s))  Blood culture (routine x 2)     Status: None (Preliminary result)   Collection Time: 02/21/15 12:21 AM  Result Value Ref Range Status   Specimen Description LEFT ANTECUBITAL  Final   Special Requests BOTTLES DRAWN AEROBIC AND ANAEROBIC 7CC  Final   Culture NO GROWTH <24 HRS  Final   Report Status PENDING  Incomplete  Blood culture (routine x 2)     Status: None (Preliminary result)   Collection Time: 02/21/15 12:27 AM  Result Value Ref Range Status   Specimen Description BLOOD RIGHT ARM  Final   Special Requests BOTTLES DRAWN AEROBIC AND ANAEROBIC 7CC  Final   Culture NO GROWTH <24 HRS  Final   Report Status PENDING  Incomplete  MRSA PCR Screening     Status: None   Collection Time: 02/21/15  3:45 AM  Result Value Ref Range Status   MRSA by PCR NEGATIVE NEGATIVE Final    Comment:        The GeneXpert MRSA Assay (FDA approved for NASAL specimens only), is one component  of a comprehensive MRSA colonization surveillance program. It is not intended to diagnose MRSA infection nor to guide or monitor treatment for MRSA infections.     Medical History: History reviewed. No pertinent past medical history.  Medications:  Prescriptions prior to admission  Medication Sig Dispense Refill Last Dose  . cyclobenzaprine (FLEXERIL) 5 MG tablet Take 1 tablet (5 mg total) by mouth 3 (three) times daily as needed for muscle spasms. 30 tablet 0   . ibuprofen (ADVIL,MOTRIN) 600 MG tablet Take 1 tablet (600 mg total) by mouth every 6 (six) hours as needed. 30 tablet 0   . traMADol (ULTRAM) 50 MG tablet Take 1 tablet (50 mg total) by mouth every 6 (six) hours as needed. 15 tablet 0    Assessment: 19 yo presented with fever, chills and L side back pain. She was hypotensive ad tachycardic. Lactate mildly elevated, WBC elevated at 29.6. +UA and suspected pyelonephritis. Was treated previously with Rocephin and will treat again with Rocephin.  Vancomycin added to cover potential resistant urinary pathogens. Rocephin 2 GM IV total dose given last evening.  Goal of Therapy:  Vancomycin trough level 15-20 mcg/ml  Plan:  Vancomycin 1 GM IV loading dose, then 500 mg IV every 8 hours Rocephin 1 GM IV every 24 hours Vancomycin trough at steady state Monitor renal function Labs per protocol  Raquel James, Mayuri Staples Bennett 02/21/2015,8:19 AM

## 2015-02-21 NOTE — Progress Notes (Signed)
Patent has had five 1 Liter Normal Saline boluses with a low blood pressures still running in the high 70's / low 80's systolic and diastolic average 42'V with low MAP's mid 50's and increasing tachycardia.  The hospitalist and e-link doctors made aware of the situation and abnormal CT of the abdomen.  The patient's color has become pallor than previous in the Emergency department.  And complaining of left side radiating flank pain.

## 2015-02-21 NOTE — ED Provider Notes (Signed)
CSN: [id number]     Arrival date & time 02/20/15  2337 History  This chart was scribed for Geoffery Lyons, MD by Karle Plumber, ED Scribe. This patient was seen in room APA03/APA03 and the patient's care was started at 12:12 AM.  Chief Complaint  Patient presents with  . Chills  . Emesis   HPI  HPI Comments:  [name] is a 19 y.o. female who presents to the Emergency Department complaining of fever and chills. She reports some associated emesis and severe HA. Pt states she was seen here for back pain two days ago and was treated with Tramadol and Ibuprofen. She states since she has been experiencing the symptoms. She states she has been taking Ibuprofen today. She denies modifying factors. Denies difficulty urinating, dysuria, diarrhea, sore throat or cough. She reports normal periods with the last one being approximately three weeks ago.  History reviewed. No pertinent past medical history. History reviewed. No pertinent past surgical history. No family history on file. History  Substance Use Topics  . Smoking status: Current Every Day Smoker -- 0.50 packs/day  . Smokeless tobacco: Not on file  . Alcohol Use: No   OB History    No data available     Review of Systems  Constitutional: Positive for fever and chills.  HENT: Negative for sore throat.   Respiratory: Negative for cough.   Gastrointestinal: Positive for nausea, vomiting and abdominal pain.  Genitourinary: Negative for dysuria and difficulty urinating.  Neurological: Positive for headaches.  All other systems reviewed and are negative.   Allergies  Review of patient's allergies indicates no known allergies.  Home Medications   Prior to Admission medications   Medication Sig Start Date End Date Taking? Authorizing Provider  cyclobenzaprine (FLEXERIL) 5 MG tablet Take 1 tablet (5 mg total) by mouth 3 (three) times daily as needed for muscle spasms. 02/18/15  Yes Burgess Amor, PA-C  ibuprofen (ADVIL,MOTRIN) 600 MG  tablet Take 1 tablet (600 mg total) by mouth every 6 (six) hours as needed. 02/18/15  Yes Burgess Amor, PA-C  traMADol (ULTRAM) 50 MG tablet Take 1 tablet (50 mg total) by mouth every 6 (six) hours as needed. 02/18/15  Yes Burgess Amor, PA-C   Triage Vitals: BP 84/45 mmHg  Pulse 167  Temp(Src) 102.8 F (39.3 C) (Oral)  Resp 20  Ht 5' (1.524 m)  Wt 102 lb (46.267 kg)  BMI 19.92 kg/m2  SpO2 100%  LMP 01/26/2015 Physical Exam  Constitutional: She is oriented to person, place, and time. She appears well-developed and well-nourished. No distress.  HENT:  Head: Normocephalic and atraumatic.  Mouth/Throat: Oropharynx is clear and moist.  Eyes: EOM are normal. Pupils are equal, round, and reactive to light.  Neck: Normal range of motion.  Cardiovascular: Regular rhythm and normal heart sounds.  Tachycardia present.  Exam reveals no gallop and no friction rub.   No murmur heard. Pulmonary/Chest: Effort normal and breath sounds normal. No respiratory distress. She has no wheezes. She has no rales.  Abdominal: Soft. Bowel sounds are normal. She exhibits no distension. There is no tenderness. There is no rebound and no guarding.  Musculoskeletal: Normal range of motion. She exhibits no edema.  Neurological: She is alert and oriented to person, place, and time.  Skin: Skin is warm and dry. She is not diaphoretic. There is pallor.  Psychiatric: She has a normal mood and affect. Her behavior is normal.  Nursing note and vitals reviewed.   ED Course  Procedures (  including critical care time) DIAGNOSTIC STUDIES: Oxygen Saturation is 100% on RA, normal by my interpretation.   COORDINATION OF CARE: 12:18 AM- Will order Tylenol, CXR and blood work. Pt verbalizes understanding and agrees to plan.  Medications  acetaminophen (TYLENOL) tablet 650 mg (not administered)    Labs Review Labs Reviewed  CULTURE, BLOOD (ROUTINE X 2)  CULTURE, BLOOD (ROUTINE X 2)  COMPREHENSIVE METABOLIC PANEL  CBC WITH  DIFFERENTIAL/PLATELET  URINALYSIS, ROUTINE W REFLEX MICROSCOPIC (NOT AT [facility])  PREGNANCY, URINE  I-STAT CG4 LACTIC ACID, ED    Imaging Review No results found.   EKG Interpretation   Date/Time:  Saturday February 21 2015 00:10:18 EDT Ventricular Rate:  185 PR Interval:  87 QRS Duration: 90 QT Interval:  308 QTC Calculation: 540 R Axis:   0 Text Interpretation:  Sinus tachycardia Baseline wander in lead(s) II  Confirmed by Kassidy Dockendorf  MD, Oshea Percival ([id number]) on 02/21/2015 3:01:04 AM      MDM   Final diagnoses:  None    Patient is a 19 year old female who presents with complaints of fever, chills, and generalized malaise which started yesterday. She was seen here a few days ago with complaints of left-sided back pain that was felt to be musculoskeletal in nature. When she arrived, she is febrile, tachycardic, and hypotensive. Code sepsis was initiated and cultures of the blood, urine, and chest x-ray were obtained. She had a mildly elevated lactate and a significantly elevated white count. Urinalysis reveals too numerous to count white cells and I suspect the cause of her fever is a pyelonephritis. She was treated with Rocephin and hydrated with normal saline. This improved her heart rate and blood pressure somewhat. I've spoken with Dr. Alvester Morin from the hospitalist service who will evaluate and admit the patient for further observation and antibiotics.  CRITICAL CARE Performed by: Geoffery Lyons Total critical care time: 45 minutes Critical care time was exclusive of separately billable procedures and treating other patients. Critical care was necessary to treat or prevent imminent or life-threatening deterioration. Critical care was time spent personally by me on the following activities: development of treatment plan with patient and/or surrogate as well as nursing, discussions with consultants, evaluation of patient's response to treatment, examination of patient, obtaining history from patient  or surrogate, ordering and performing treatments and interventions, ordering and review of laboratory studies, ordering and review of radiographic studies, pulse oximetry and re-evaluation of patient's condition.   I personally performed the services described in this documentation, which was scribed in my presence. The recorded information has been reviewed and is accurate.    Geoffery Lyons, MD 02/21/15 [phone]

## 2015-02-21 NOTE — Progress Notes (Signed)
Dr. Conley Rolls notified of patients positive blood cultures, anaerobic only, gram negative rods.

## 2015-02-22 DIAGNOSIS — R6521 Severe sepsis with septic shock: Secondary | ICD-10-CM

## 2015-02-22 DIAGNOSIS — A419 Sepsis, unspecified organism: Secondary | ICD-10-CM

## 2015-02-22 DIAGNOSIS — N10 Acute tubulo-interstitial nephritis: Secondary | ICD-10-CM | POA: Diagnosis present

## 2015-02-22 DIAGNOSIS — E876 Hypokalemia: Secondary | ICD-10-CM | POA: Diagnosis present

## 2015-02-22 LAB — CBC WITH DIFFERENTIAL/PLATELET
Basophils Absolute: 0 10*3/uL (ref 0.0–0.1)
Basophils Relative: 0 % (ref 0–1)
EOS ABS: 0 10*3/uL (ref 0.0–0.7)
Eosinophils Relative: 0 % (ref 0–5)
HEMATOCRIT: 30.6 % — AB (ref 36.0–46.0)
Hemoglobin: 10 g/dL — ABNORMAL LOW (ref 12.0–15.0)
Lymphocytes Relative: 8 % — ABNORMAL LOW (ref 12–46)
Lymphs Abs: 1.5 10*3/uL (ref 0.7–4.0)
MCH: 31.6 pg (ref 26.0–34.0)
MCHC: 32.7 g/dL (ref 30.0–36.0)
MCV: 96.8 fL (ref 78.0–100.0)
Monocytes Absolute: 1.7 10*3/uL — ABNORMAL HIGH (ref 0.1–1.0)
Monocytes Relative: 9 % (ref 3–12)
NEUTROS ABS: 15.4 10*3/uL — AB (ref 1.7–7.7)
NEUTROS PCT: 83 % — AB (ref 43–77)
PLATELETS: 224 10*3/uL (ref 150–400)
RBC: 3.16 MIL/uL — ABNORMAL LOW (ref 3.87–5.11)
RDW: 13.7 % (ref 11.5–15.5)
WBC: 18.6 10*3/uL — ABNORMAL HIGH (ref 4.0–10.5)

## 2015-02-22 LAB — COMPREHENSIVE METABOLIC PANEL
ALT: 21 U/L (ref 14–54)
AST: 21 U/L (ref 15–41)
Albumin: 2.3 g/dL — ABNORMAL LOW (ref 3.5–5.0)
Alkaline Phosphatase: 74 U/L (ref 38–126)
Anion gap: 5 (ref 5–15)
BILIRUBIN TOTAL: 0.7 mg/dL (ref 0.3–1.2)
BUN: 7 mg/dL (ref 6–20)
CALCIUM: 7.4 mg/dL — AB (ref 8.9–10.3)
CO2: 17 mmol/L — ABNORMAL LOW (ref 22–32)
CREATININE: 0.5 mg/dL (ref 0.44–1.00)
Chloride: 118 mmol/L — ABNORMAL HIGH (ref 101–111)
GFR calc Af Amer: >60 mL/min (ref >60)
Glucose, Bld: 80 mg/dL (ref 65–99)
Potassium: 2.9 mmol/L — ABNORMAL LOW (ref 3.5–5.1)
SODIUM: 140 mmol/L (ref 135–145)
TOTAL PROTEIN: 4.8 g/dL — AB (ref 6.5–8.1)

## 2015-02-22 LAB — TSH: TSH: 1.341 u[IU]/mL (ref 0.350–4.500)

## 2015-02-22 MED ORDER — POTASSIUM CHLORIDE IN NACL 20-0.9 MEQ/L-% IV SOLN
INTRAVENOUS | Status: DC
  Administered 2015-02-22 (×2): via INTRAVENOUS

## 2015-02-22 MED ORDER — POTASSIUM CHLORIDE CRYS ER 20 MEQ PO TBCR
40.0000 meq | EXTENDED_RELEASE_TABLET | Freq: Every day | ORAL | Status: DC
  Administered 2015-02-23 – 2015-02-26 (×4): 40 meq via ORAL
  Filled 2015-02-22 (×5): qty 2

## 2015-02-22 MED ORDER — PANTOPRAZOLE SODIUM 40 MG PO TBEC
40.0000 mg | DELAYED_RELEASE_TABLET | Freq: Two times a day (BID) | ORAL | Status: DC
  Administered 2015-02-22 – 2015-02-26 (×8): 40 mg via ORAL
  Filled 2015-02-22 (×9): qty 1

## 2015-02-22 MED ORDER — POTASSIUM CHLORIDE 10 MEQ/100ML IV SOLN
10.0000 meq | INTRAVENOUS | Status: AC
Start: 2015-02-22 — End: 2015-02-22
  Administered 2015-02-22 (×4): 10 meq via INTRAVENOUS
  Filled 2015-02-22 (×4): qty 100

## 2015-02-22 MED ORDER — ALUM & MAG HYDROXIDE-SIMETH 200-200-20 MG/5ML PO SUSP
30.0000 mL | ORAL | Status: DC | PRN
  Administered 2015-02-22: 30 mL via ORAL
  Filled 2015-02-22: qty 30

## 2015-02-22 NOTE — Progress Notes (Signed)
[facility] PROGRESS NOTE  [name] YNW:[id number] DOB: January 08, 1996    PCP:   No PCP Per Patient   HPI: [name] is an 19 y.o. female admitted for urosepsis, septic shock, and bilateral pyelonephritis (CT showed the left, but she has bilateral flank pain), anemia, and hypokalemia.   She is feeling better, and tolerated her hypotension quite well.  I am thinking her baseline BP may be on the low side anyway given she is small.  BP this morning was 112.  HR 107.  Sitting up conversing.   Rewiew of Systems:  Constitutional: Negative for malaise, fever and chills. No significant weight loss or weight gain Eyes: Negative for eye pain, redness and discharge, diplopia, visual changes, or flashes of light. ENMT: Negative for ear pain, hoarseness, nasal congestion, sinus pressure and sore throat. No headaches; tinnitus, drooling, or problem swallowing. Cardiovascular: Negative for chest pain, palpitations, diaphoresis, dyspnea and peripheral edema. ; No orthopnea, PND Respiratory: Negative for cough, hemoptysis, wheezing and stridor. No pleuritic chestpain. Gastrointestinal: Negative for nausea, vomiting, diarrhea, constipation, abdominal pain, melena, blood in stool, hematemesis, jaundice and rectal bleeding.    Genitourinary: Negative for frequency, dysuria, incontinence,flank pain and hematuria; Musculoskeletal: Negative for and neck pain. Negative for swelling and trauma.;  Skin: . Negative for pruritus, rash, abrasions, bruising and skin lesion.; ulcerations Neuro: Negative for headache, lightheadedness and neck stiffness. Negative for weakness, altered level of consciousness , altered mental status, extremity weakness, burning feet, involuntary movement, seizure and syncope.  Psych: negative for anxiety, depression, insomnia, tearfulness, panic attacks, hallucinations, paranoia, suicidal or homicidal ideation    History reviewed. No pertinent past medical history.  History reviewed.  No pertinent past surgical history.  Medications:  HOME MEDS: Prior to Admission medications   Medication Sig Start Date End Date Taking? Authorizing Provider  cyclobenzaprine (FLEXERIL) 5 MG tablet Take 1 tablet (5 mg total) by mouth 3 (three) times daily as needed for muscle spasms. 02/18/15  Yes Burgess Amor, PA-C  ibuprofen (ADVIL,MOTRIN) 600 MG tablet Take 1 tablet (600 mg total) by mouth every 6 (six) hours as needed. Patient taking differently: Take 600 mg by mouth every 6 (six) hours as needed (back pain).  02/18/15  Yes Burgess Amor, PA-C  traMADol (ULTRAM) 50 MG tablet Take 1 tablet (50 mg total) by mouth every 6 (six) hours as needed. Patient taking differently: Take 50 mg by mouth every 6 (six) hours as needed for moderate pain.  02/18/15  Yes Burgess Amor, PA-C     Allergies:  No Known Allergies  Social History:   reports that she has been smoking.  She uses smokeless tobacco. She reports that she does not drink alcohol or use illicit drugs.  Family History: History reviewed. No pertinent family history.   Physical Exam: Filed Vitals:   02/22/15 0300 02/22/15 0400 02/22/15 0500 02/22/15 0600  BP: 95/59  Pulse:      Temp:  98.3 F (36.8 C)    TempSrc:  Oral    Resp: Height:      Weight:   54 kg (119 lb 0.8 oz)   SpO2:       Blood pressure 95/59, pulse 96, temperature 98.3 F (36.8 C), temperature source Oral, resp. rate 6, height  (1.626 m), weight 54 kg (119 lb 0.8 oz), last menstrual period 01/26/2015, SpO2 100 %.  GEN:  Pleasant  patient lying in the stretcher in no acute distress; cooperative with exam.  PSYCH:  alert and oriented x4; does not appear anxious or depressed; affect is appropriate. HEENT: Mucous membranes pink and anicteric; PERRLA; EOM intact; no cervical lymphadenopathy nor thyromegaly or carotid bruit; no JVD; There were no stridor. Neck is very supple. Breasts:: Not examined CHEST WALL: No tenderness CHEST: Normal  respiration, clear to auscultation bilaterally.  HEART: Regular rate and rhythm.  There are no murmur, rub, or gallops.   BACK: No kyphosis or scoliosis; She has bilateral CVA tenderness.  ABDOMEN: soft and non-tender; no masses, no organomegaly, normal abdominal bowel sounds; no pannus; no intertriginous candida. There is no rebound and no distention. Rectal Exam: Not done EXTREMITIES: No bone or joint deformity; age-appropriate arthropathy of the hands and knees; no edema; no ulcerations.  There is no calf tenderness. Genitalia: not examined PULSES: 2+ and symmetric SKIN: Normal hydration no rash or ulceration CNS: Cranial nerves 2-12 grossly intact no focal lateralizing neurologic deficit.  Speech is fluent; uvula elevated with phonation, facial symmetry and tongue midline. DTR are normal bilaterally, cerebella exam is intact, barbinski is negative and strengths are equaled bilaterally.  No sensory loss.   Labs on Admission:  Basic Metabolic Panel:  Recent Labs Lab 02/21/15 0014 02/21/15 0356 02/22/15 0510  NA 138 139 140  K 3.7 3.3* 2.9*  CL 103 109 118*  CO2 22 21* 17*  GLUCOSE 95 110* 80  BUN 14 13 7   CREATININE 0.74 0.61 0.50  CALCIUM 8.9 7.2* 7.4*   Liver Function Tests:  Recent Labs Lab 02/21/15 0014 02/21/15 0356 02/22/15 0510  AST 18 13* 21  ALT 13* 11* 21  ALKPHOS 53 45 74  BILITOT 0.9 0.6 0.7  PROT 7.3 5.3* 4.8*  ALBUMIN 3.7 2.6* 2.3*   No results for input(s): LIPASE, AMYLASE in the last 168 hours. No results for input(s): AMMONIA in the last 168 hours. CBC:  Recent Labs Lab 02/21/15 0014 02/21/15 0356 02/22/15 0510  WBC 29.6* 28.3* 18.6*  NEUTROABS 27.4* 23.3* 15.4*  HGB 12.9 10.7* 10.0*  HCT 38.9 33.1* 30.6*  MCV 95.6 96.2 96.8  PLT 305 242 224    Radiological Exams on Admission: Dg Chest 2 View  02/21/2015   CLINICAL DATA:  Acute onset of fever and chills. Vomiting and severe headache. Back pain for 4 days. Initial encounter.  EXAM: CHEST   2 VIEW  COMPARISON:  None.  FINDINGS: The lungs are well-aerated and clear. There is no evidence of focal opacification, pleural effusion or pneumothorax.  The heart is normal in size; the mediastinal contour is within normal limits. No acute osseous abnormalities are seen.  IMPRESSION: No acute cardiopulmonary process seen.   Electronically Signed   By: Roanna Raider M.D.   On: 02/21/2015 01:38   Ct Abdomen Pelvis W Contrast  02/21/2015   CLINICAL DATA:  Fever chills and back pain for 2 days.  EXAM: CT ABDOMEN AND PELVIS WITH CONTRAST  TECHNIQUE: Multidetector CT imaging of the abdomen and pelvis was performed using the standard protocol following bolus administration of intravenous contrast.  CONTRAST:  59mL OMNIPAQUE IOHEXOL 300 MG/ML SOLN, OMNIPAQUE IOHEXOL 300 MG/ML SOLN  COMPARISON:  None.  FINDINGS: There is moderate periportal edema and pericholecystic fluid. This is a nonspecific finding. There also is a small volume free fluid collected dependently in the pelvis. No focal liver lesions are evident. There is no bile duct dilatation. There are normal appearances of the spleen, pancreas, and adrenals.  There are multiple wedge-shaped areas of hypodensity in the  left kidney, extending from the medulla out to the edge of the cortex. This suggests pyelonephritis. There is no hydronephrosis. There is no ureteral dilatation. The right kidney is normal.  Bowel and mesentery appear unremarkable. Minimal peripheral opacities are present in the lung bases, perhaps atelectatic.  The uterus and ovaries appear unremarkable.  No significant musculoskeletal abnormality is evident.  IMPRESSION: 1. Abnormal left kidney, with appearances suggesting pyelonephritis. No hydronephrosis or ureteral dilatation. 2. Hepatic periportal edema and pericholecystic fluid, nonspecific.   Electronically Signed   By: Ellery Plunk M.D.   On: 02/21/2015 05:39   Assessment/Plan Present on Admission:  .  Sepsis Hypokalemia Pyelonephritis Urosepsis and septic shock.  PLAN:  Will continue current tx with basal IVF, K supplement, continue with Rocephin and Vancomycin until bacteremia organism is identified.  She likely has baseline low BP anyway.  Updated her mother as per her request.   She is doing better with higher BP, lower leukocytosis, lower fever, and feeling better.  Will keep in ICU today, transfer to floor tomorrow if stable.   Other plans as per orders.  Code Status: FULL Unk Lightning, MD. [facility] Pager [phone] 7pm to 7am.  02/22/2015, 8:00 AM

## 2015-02-23 ENCOUNTER — Inpatient Hospital Stay (HOSPITAL_COMMUNITY): Payer: Medicaid Other

## 2015-02-23 DIAGNOSIS — R0902 Hypoxemia: Secondary | ICD-10-CM

## 2015-02-23 DIAGNOSIS — R1084 Generalized abdominal pain: Secondary | ICD-10-CM

## 2015-02-23 LAB — CBC WITH DIFFERENTIAL/PLATELET
BASOS ABS: 0 10*3/uL (ref 0.0–0.1)
BASOS PCT: 0 % (ref 0–1)
Basophils Absolute: 0 10*3/uL (ref 0.0–0.1)
Basophils Relative: 0 % (ref 0–1)
Eosinophils Absolute: 0 10*3/uL (ref 0.0–0.7)
Eosinophils Absolute: 0 10*3/uL (ref 0.0–0.7)
Eosinophils Relative: 0 % (ref 0–5)
Eosinophils Relative: 0 % (ref 0–5)
HCT: 31.4 % — ABNORMAL LOW (ref 36.0–46.0)
HEMATOCRIT: 33.1 % — AB (ref 36.0–46.0)
HEMOGLOBIN: 11 g/dL — AB (ref 12.0–15.0)
Hemoglobin: 10.3 g/dL — ABNORMAL LOW (ref 12.0–15.0)
LYMPHS ABS: 1 10*3/uL (ref 0.7–4.0)
LYMPHS PCT: 9 % — AB (ref 12–46)
Lymphocytes Relative: 9 % — ABNORMAL LOW (ref 12–46)
Lymphs Abs: 0.9 10*3/uL (ref 0.7–4.0)
MCH: 31.2 pg (ref 26.0–34.0)
MCH: 31.1 pg (ref 26.0–34.0)
MCHC: 32.8 g/dL (ref 30.0–36.0)
MCHC: 33.2 g/dL (ref 30.0–36.0)
MCV: 95.2 fL (ref 78.0–100.0)
MCV: 93.5 fL (ref 78.0–100.0)
MONO ABS: 0.7 10*3/uL (ref 0.1–1.0)
Monocytes Absolute: 1.2 10*3/uL — ABNORMAL HIGH (ref 0.1–1.0)
Monocytes Relative: 10 % (ref 3–12)
Monocytes Relative: 7 % (ref 3–12)
NEUTROS PCT: 81 % — AB (ref 43–77)
Neutro Abs: 9.1 10*3/uL — ABNORMAL HIGH (ref 1.7–7.7)
Neutro Abs: 8.4 10*3/uL — ABNORMAL HIGH (ref 1.7–7.7)
Neutrophils Relative %: 84 % — ABNORMAL HIGH (ref 43–77)
PLATELETS: 205 10*3/uL (ref 150–400)
Platelets: 241 10*3/uL (ref 150–400)
RBC: 3.3 MIL/uL — AB (ref 3.87–5.11)
RBC: 3.54 MIL/uL — ABNORMAL LOW (ref 3.87–5.11)
RDW: 13.5 % (ref 11.5–15.5)
RDW: 13.4 % (ref 11.5–15.5)
WBC: 11.3 10*3/uL — ABNORMAL HIGH (ref 4.0–10.5)
WBC: 9.9 10*3/uL (ref 4.0–10.5)

## 2015-02-23 LAB — COMPREHENSIVE METABOLIC PANEL
ALBUMIN: 2.1 g/dL — AB (ref 3.5–5.0)
ALBUMIN: 2.4 g/dL — AB (ref 3.5–5.0)
ALT: 21 U/L (ref 14–54)
ALT: 21 U/L (ref 14–54)
ANION GAP: 7 (ref 5–15)
AST: 17 U/L (ref 15–41)
AST: 15 U/L (ref 15–41)
Alkaline Phosphatase: 71 U/L (ref 38–126)
Alkaline Phosphatase: 75 U/L (ref 38–126)
Anion gap: 6 (ref 5–15)
BILIRUBIN TOTAL: 0.4 mg/dL (ref 0.3–1.2)
BUN: 5 mg/dL — AB (ref 6–20)
CO2: 19 mmol/L — ABNORMAL LOW (ref 22–32)
CO2: 21 mmol/L — AB (ref 22–32)
CREATININE: 0.53 mg/dL (ref 0.44–1.00)
Calcium: 7.6 mg/dL — ABNORMAL LOW (ref 8.9–10.3)
Calcium: 7.7 mg/dL — ABNORMAL LOW (ref 8.9–10.3)
Chloride: 113 mmol/L — ABNORMAL HIGH (ref 101–111)
Chloride: 109 mmol/L (ref 101–111)
Creatinine, Ser: 0.59 mg/dL (ref 0.44–1.00)
GFR calc Af Amer: >60 mL/min (ref >60)
GFR calc Af Amer: >60 mL/min (ref >60)
GFR calc non Af Amer: >60 mL/min (ref >60)
GFR calc non Af Amer: >60 mL/min (ref >60)
Glucose, Bld: 95 mg/dL (ref 65–99)
Glucose, Bld: 121 mg/dL — ABNORMAL HIGH (ref 65–99)
Potassium: 3.7 mmol/L (ref 3.5–5.1)
Potassium: 3.5 mmol/L (ref 3.5–5.1)
SODIUM: 137 mmol/L (ref 135–145)
Sodium: 138 mmol/L (ref 135–145)
Total Bilirubin: 0.8 mg/dL (ref 0.3–1.2)
Total Protein: 5 g/dL — ABNORMAL LOW (ref 6.5–8.1)
Total Protein: 5.6 g/dL — ABNORMAL LOW (ref 6.5–8.1)

## 2015-02-23 LAB — CULTURE, BLOOD (ROUTINE X 2)

## 2015-02-23 LAB — URINE CULTURE: Colony Count: >=100000

## 2015-02-23 LAB — LACTIC ACID, PLASMA: Lactic Acid, Venous: 0.8 mmol/L (ref 0.5–2.0)

## 2015-02-23 MED ORDER — ALBUTEROL SULFATE (2.5 MG/3ML) 0.083% IN NEBU
2.5000 mg | INHALATION_SOLUTION | Freq: Four times a day (QID) | RESPIRATORY_TRACT | Status: DC
  Administered 2015-02-23: 2.5 mg via RESPIRATORY_TRACT
  Filled 2015-02-23 (×2): qty 3

## 2015-02-23 MED ORDER — SODIUM CHLORIDE 0.9 % IV SOLN: INTRAVENOUS | Status: DC

## 2015-02-23 MED ORDER — SODIUM CHLORIDE 0.9 % IV BOLUS (SEPSIS)
500.0000 mL | Freq: Once | INTRAVENOUS | Status: AC
  Administered 2015-02-24: 500 mL via INTRAVENOUS

## 2015-02-23 MED ORDER — CITALOPRAM HYDROBROMIDE 20 MG PO TABS
20.0000 mg | ORAL_TABLET | Freq: Every day | ORAL | Status: DC
  Administered 2015-02-23 – 2015-02-26 (×4): 20 mg via ORAL
  Filled 2015-02-23 (×4): qty 1

## 2015-02-23 MED ORDER — GUAIFENESIN 100 MG/5ML PO SOLN
200.0000 mg | Freq: Four times a day (QID) | ORAL | Status: DC | PRN
  Administered 2015-02-23: 200 mg via ORAL
  Filled 2015-02-23: qty 10

## 2015-02-23 MED ORDER — GUAIFENESIN 100 MG/5ML PO SOLN
200.0000 mg | Freq: Four times a day (QID) | ORAL | Status: DC | PRN
  Filled 2015-02-23: qty 10

## 2015-02-23 MED ORDER — LEVALBUTEROL HCL 0.63 MG/3ML IN NEBU
0.6300 mg | INHALATION_SOLUTION | Freq: Four times a day (QID) | RESPIRATORY_TRACT | Status: DC
  Administered 2015-02-23: 0.63 mg via RESPIRATORY_TRACT
  Filled 2015-02-23 (×2): qty 3

## 2015-02-23 MED ORDER — PIPERACILLIN-TAZOBACTAM 3.375 G IVPB 30 MIN
3.3750 g | Freq: Three times a day (TID) | INTRAVENOUS | Status: DC
  Administered 2015-02-23 – 2015-02-25 (×7): 3.375 g via INTRAVENOUS
  Filled 2015-02-23 (×12): qty 50

## 2015-02-23 MED ORDER — SODIUM CHLORIDE 0.9 % IV SOLN
500.0000 mg | Freq: Three times a day (TID) | INTRAVENOUS | Status: DC
  Administered 2015-02-23 – 2015-02-25 (×6): 500 mg via INTRAVENOUS
  Filled 2015-02-23 (×9): qty 500

## 2015-02-23 MED ORDER — CEFTRIAXONE SODIUM IN DEXTROSE 40 MG/ML IV SOLN
2.0000 g | INTRAVENOUS | Status: DC
  Filled 2015-02-23: qty 50

## 2015-02-23 MED ORDER — ACETAMINOPHEN 325 MG PO TABS
650.0000 mg | ORAL_TABLET | ORAL | Status: DC | PRN
  Administered 2015-02-23: 650 mg via ORAL
  Filled 2015-02-23 (×2): qty 2

## 2015-02-23 MED ORDER — GUAIFENESIN-CODEINE 100-10 MG/5ML PO SOLN
5.0000 mL | ORAL | Status: DC | PRN
  Administered 2015-02-23 – 2015-02-26 (×11): 5 mL via ORAL
  Filled 2015-02-23 (×12): qty 5

## 2015-02-23 MED ORDER — DEXTROSE 5 % IV SOLN
2.0000 g | INTRAVENOUS | Status: DC
  Filled 2015-02-23: qty 2

## 2015-02-23 NOTE — Progress Notes (Signed)
UR chart review completed.  

## 2015-02-23 NOTE — Care Management Note (Signed)
Case Management Note  Patient Details  Name: [name] MRN: [id number] Date of Birth: [birth year]  Subjective/Objective:                  Pt admitted from home with sepsis due to pyelonephritis. Pt lives with family and will return home at discharge. Pt is independent with ADL's.  Action/Plan: Will arrange PCP follow up prior to discharge. CM did discuss options for PCP. Pt to decide and notify Cm so appt can be made. Pt is aware that MD name will need to be added to Medicaid card once PCP established.  Expected Discharge Date:  02/23/15               Expected Discharge Plan:  Home/Self Care  In-House Referral:  NA  Discharge planning Services  CM Consult  Post Acute Care Choice:  NA Choice offered to:  NA  DME Arranged:    DME Agency:     HH Arranged:    HH Agency:     Status of Service:  Completed, signed off  Medicare Important Message Given:    Date Medicare IM Given:    Medicare IM give by:    Date Additional Medicare IM Given:    Additional Medicare Important Message give by:     If discussed at Long Length of Stay Meetings, dates discussed:    Additional Comments:  Cheryl Flash, RN 02/23/2015, 3:26 PM

## 2015-02-23 NOTE — Progress Notes (Signed)
Respiratory Care Note: Went in to give the patient her afternoon breathing treatment to find that her HR is at 127- 135 and SAT 84-85%. Made RN aware, held breathing treatment and put he patient on 3L of oxygen.

## 2015-02-23 NOTE — Progress Notes (Addendum)
[facility] PROGRESS NOTE  [name] ZOX:[id number] DOB: May 17, 1996    PCP:  None.  HPI: [name] is an 19 y.o. female admitted for urosepsis, septic shock, and bilateral pyelonephritis (CT showed the left, but she has bilateral flank pain), anemia, and hypokalemia. She is feeling better, and tolerated her hypotension quite well. I am thinking her baseline BP may be on the low side anyway given she is small. BPs have improved to 110, HR is still elevated at 110-120.  She has a cough, non productive.   Rewiew of Systems:  Constitutional: Negative for malaise, fever and chills. No significant weight loss or weight gain Eyes: Negative for eye pain, redness and discharge, diplopia, visual changes, or flashes of light. ENMT: Negative for ear pain, hoarseness, nasal congestion, sinus pressure and sore throat. No headaches; tinnitus, drooling, or problem swallowing. Cardiovascular: Negative for chest pain, palpitations, diaphoresis, dyspnea and peripheral edema. ; No orthopnea, PND Respiratory: Negative for cough, hemoptysis, wheezing and stridor. No pleuritic chestpain. Gastrointestinal: Negative for nausea, vomiting, diarrhea, constipation, abdominal pain, melena, blood in stool, hematemesis, jaundice and rectal bleeding.    Genitourinary: Negative for frequency, dysuria, incontinence,flank pain and hematuria; Musculoskeletal: Negative for back pain and neck pain. Negative for swelling and trauma.;  Skin: . Negative for pruritus, rash, abrasions, bruising and skin lesion.; ulcerations Neuro: Negative for headache, lightheadedness and neck stiffness. Negative for weakness, altered level of consciousness , altered mental status, extremity weakness, burning feet, involuntary movement, seizure and syncope.  Psych: negative for anxiety, depression, insomnia, tearfulness, panic attacks, hallucinations, paranoia, suicidal or homicidal ideation    History reviewed. No pertinent past medical  history.  History reviewed. No pertinent past surgical history.  Medications:  HOME MEDS: Prior to Admission medications   Medication Sig Start Date End Date Taking? Authorizing Provider  cyclobenzaprine (FLEXERIL) 5 MG tablet Take 1 tablet (5 mg total) by mouth 3 (three) times daily as needed for muscle spasms. 02/18/15  Yes Burgess Amor, PA-C  ibuprofen (ADVIL,MOTRIN) 600 MG tablet Take 1 tablet (600 mg total) by mouth every 6 (six) hours as needed. Patient taking differently: Take 600 mg by mouth every 6 (six) hours as needed (back pain).  02/18/15  Yes Burgess Amor, PA-C  traMADol (ULTRAM) 50 MG tablet Take 1 tablet (50 mg total) by mouth every 6 (six) hours as needed. Patient taking differently: Take 50 mg by mouth every 6 (six) hours as needed for moderate pain.  02/18/15  Yes Burgess Amor, PA-C     Allergies:  No Known Allergies  Social History:   reports that she has been smoking.  She uses smokeless tobacco. She reports that she does not drink alcohol or use illicit drugs.  Family History: History reviewed. No pertinent family history.   Physical Exam: Filed Vitals:   02/23/15 0500 02/23/15 0600 02/23/15 0700 02/23/15 0756  BP: 106/72 114/80 105/75   Pulse:      Temp:    98 F (36.7 C)  TempSrc:    Oral  Resp: 26 30 18    Height:      Weight: 54.8 kg (120 lb 13 oz)     SpO2:       Blood pressure 105/75, pulse 96, temperature 98 F (36.7 C), temperature source Oral, resp. rate 18, height 5\' 4"  (1.626 m), weight 54.8 kg (120 lb 13 oz), last menstrual period 01/26/2015, SpO2 98 %.  GEN:  Pleasant patient lying in the stretcher in no acute distress; cooperative with exam. PSYCH:  alert and oriented x4; does not appear anxious or depressed; affect is appropriate. HEENT: Mucous membranes pink and anicteric; PERRLA; EOM intact; no cervical lymphadenopathy nor thyromegaly or carotid bruit; no JVD; There were no stridor. Neck is very supple. Breasts:: Not examined CHEST WALL: No  tenderness CHEST: Normal respiration, minimal wheezing.  No rales.  HEART: Regular rate and rhythm.  There are no murmur, rub, or gallops.   BACK: No kyphosis or scoliosis; no CVA tenderness ABDOMEN: soft and non-tender; no masses, no organomegaly, normal abdominal bowel sounds; no pannus; no intertriginous candida. There is no rebound and no distention. Rectal Exam: Not done EXTREMITIES: No bone or joint deformity; age-appropriate arthropathy of the hands and knees; no edema; no ulcerations.  There is no calf tenderness. Genitalia: not examined PULSES: 2+ and symmetric SKIN: Normal hydration no rash or ulceration CNS: Cranial nerves 2-12 grossly intact no focal lateralizing neurologic deficit.  Speech is fluent; uvula elevated with phonation, facial symmetry and tongue midline. DTR are normal bilaterally, cerebella exam is intact, barbinski is negative and strengths are equaled bilaterally.  No sensory loss.   Labs on Admission:  Basic Metabolic Panel:  Recent Labs Lab 02/21/15 0014 02/21/15 0356 02/22/15 0510 02/23/15 0442  NA 138 139 140 138  K 3.7 3.3* 2.9* 3.7  CL 103 109 118* 113*  CO2 22 21* 17* 19*  GLUCOSE 95 110* 80 95  BUN 5*  CREATININE 0.74 0.61 0.50 0.53  CALCIUM 8.9 7.2* 7.4* 7.6*   Liver Function Tests:  Recent Labs Lab 02/21/15 0014 02/21/15 0356 02/22/15 0510 02/23/15 0442  AST 18 13* 21 17  ALT 13* 11* 21 21  ALKPHOS 53 45 74 71  BILITOT 0.9 0.6 0.7 0.8  PROT 7.3 5.3* 4.8* 5.0*  ALBUMIN 3.7 2.6* 2.3* 2.1*   CBC:  Recent Labs Lab 02/21/15 0014 02/21/15 0356 02/22/15 0510 02/23/15 0442  WBC 29.6* 28.3* 18.6* 11.3*  NEUTROABS 27.4* 23.3* 15.4* 9.1*  HGB 12.9 10.7* 10.0* 10.3*  HCT 38.9 33.1* 30.6* 31.4*  MCV 95.6 96.2 96.8 95.2  PLT 305 242 224 205    Radiological Exams on Admission:  Assessment/Plan Present on Admission:  . Sepsis . Hypokalemia  PLAN: Will continue current tx with basal IVF, K supplement, continue with  Rocephin and since it is E Coli sepsis, will d/c Vancomycin.   She likely has baseline low BP anyway. Updated her mother as per her request. She is doing better with higher BP, lower leukocytosis, lower fever, and feeling better. Will transfer her to the telemeter floor.  Give some cough medicine, along with neb tx. Her TSH is normal.  Fiance and patient said she has been really depressed, will start Lexapro or Celexa (formulary) today as well.  Other plans as per orders.  Code Status: FULL Unk Lightning, MD. [facility] Pager [phone] 7pm to 7am.  02/23/2015, 8:58 AM    ADDENDUM:  CXR was done as clinically suspected she has PNA.  She is also hypoxic.  CXR showed new right middle lobe and lower lobe PNA, we will change her antibiotics to Zenaida Niece and Zosyn now.  It will cover her E Coli urosepsis as well.

## 2015-02-23 NOTE — Progress Notes (Signed)
ANTIBIOTIC CONSULT NOTE  Pharmacy Consult for Vancomycin Indication: urosepsis  No Known Allergies  Patient Measurements: Height:  (162.6 cm) Weight: 120 lb 13 oz (54.8 kg) IBW/kg (Calculated) : 54.7  Vital Signs: Temp: 102.9 F (39.4 C) (06/13 1259) Temp Source: Oral (06/13 1259) BP: 102/55 mmHg (06/13 1259) Pulse Rate: 147 (06/13 1259) Intake/Output from previous day: 06/12 0701 - 06/13 0700 In: 4575 [P.O.:1200; I.V.:2625; IV Piggyback:750] Out: -  Intake/Output from this shift: Total I/O In: 480 [P.O.:480] Out: -   Labs:  Recent Labs  02/21/15 0356 02/22/15 0510 02/23/15 0442  WBC 28.3* 18.6* 11.3*  HGB 10.7* 10.0* 10.3*  PLT 242 224 205  CREATININE 0.61 0.50 0.53   Estimated Creatinine Clearance: 97.7 mL/min (by C-G formula based on Cr of 0.53). No results for input(s): VANCOTROUGH, VANCOPEAK, VANCORANDOM, GENTTROUGH, GENTPEAK, GENTRANDOM, TOBRATROUGH, TOBRAPEAK, TOBRARND, AMIKACINPEAK, AMIKACINTROU, AMIKACIN in the last 72 hours.   Microbiology: Recent Results (from the past 720 hour(s))  Blood culture (routine x 2)     Status: None   Collection Time: 02/21/15 12:21 AM  Result Value Ref Range Status   Specimen Description LEFT ANTECUBITAL  Final   Special Requests BOTTLES DRAWN AEROBIC AND ANAEROBIC [facility]  Final   Culture   Final    ESCHERICHIA COLI Note: Gram Stain Report Called to,Read Back By and Verified With: ROWE C 1609 02/21/15 BY THOMPSON S Performed at [facility] Performed at [facility]    Report Status 02/23/2015 FINAL  Final   Organism ID, Bacteria ESCHERICHIA COLI  Final      Susceptibility   Escherichia coli - MIC*    AMPICILLIN 8 SENSITIVE Sensitive     AMPICILLIN/SULBACTAM 8 SENSITIVE Sensitive     CEFAZOLIN <=4 SENSITIVE Sensitive     CEFEPIME <=1 SENSITIVE Sensitive     CEFTAZIDIME <=1 SENSITIVE Sensitive     CEFTRIAXONE <=1 SENSITIVE Sensitive     CIPROFLOXACIN <=0.25 SENSITIVE Sensitive     GENTAMICIN <=1  SENSITIVE Sensitive     IMIPENEM <=0.25 SENSITIVE Sensitive     PIP/TAZO <=4 SENSITIVE Sensitive     TOBRAMYCIN <=1 SENSITIVE Sensitive     TRIMETH/SULFA <=20 SENSITIVE Sensitive     * ESCHERICHIA COLI  Blood culture (routine x 2)     Status: None (Preliminary result)   Collection Time: 02/21/15 12:27 AM  Result Value Ref Range Status   Specimen Description BLOOD RIGHT ARM RN BLC DRAW  Final   Special Requests BOTTLES DRAWN AEROBIC AND ANAEROBIC 7CC  Final   Culture NO GROWTH 2 DAYS  Final   Report Status PENDING  Incomplete  Culture, Urine     Status: None   Collection Time: 02/21/15  1:16 AM  Result Value Ref Range Status   Specimen Description URINE, CLEAN CATCH  Final   Special Requests NONE  Final   Colony Count   Final    >=100,000 COLONIES/ML Performed at [facility]    Culture   Final    ESCHERICHIA COLI Performed at [facility]    Report Status 02/23/2015 FINAL  Final   Organism ID, Bacteria ESCHERICHIA COLI  Final      Susceptibility   Escherichia coli - MIC*    AMPICILLIN 8 SENSITIVE Sensitive     CEFAZOLIN <=4 SENSITIVE Sensitive     CEFTRIAXONE <=1 SENSITIVE Sensitive     CIPROFLOXACIN <=0.25 SENSITIVE Sensitive     GENTAMICIN <=1 SENSITIVE Sensitive     LEVOFLOXACIN <=0.12 SENSITIVE Sensitive  NITROFURANTOIN <=16 SENSITIVE Sensitive     TOBRAMYCIN <=1 SENSITIVE Sensitive     TRIMETH/SULFA <=20 SENSITIVE Sensitive     PIP/TAZO <=4 SENSITIVE Sensitive     * ESCHERICHIA COLI  MRSA PCR Screening     Status: None   Collection Time: 02/21/15  3:45 AM  Result Value Ref Range Status   MRSA by PCR NEGATIVE NEGATIVE Final    Comment:        The GeneXpert MRSA Assay (FDA approved for NASAL specimens only), is one component of a comprehensive MRSA colonization surveillance program. It is not intended to diagnose MRSA infection nor to guide or monitor treatment for MRSA infections.     Medical History: History reviewed. No pertinent  past medical history.  Medications:  Prescriptions prior to admission  Medication Sig Dispense Refill Last Dose  . cyclobenzaprine (FLEXERIL) 5 MG tablet Take 1 tablet (5 mg total) by mouth 3 (three) times daily as needed for muscle spasms. 30 tablet 0 02/20/2015 at Unknown time  . ibuprofen (ADVIL,MOTRIN) 600 MG tablet Take 1 tablet (600 mg total) by mouth every 6 (six) hours as needed. (Patient taking differently: Take 600 mg by mouth every 6 (six) hours as needed (back pain). ) 30 tablet 0 02/20/2015 at Unknown time  . traMADol (ULTRAM) 50 MG tablet Take 1 tablet (50 mg total) by mouth every 6 (six) hours as needed. (Patient taking differently: Take 50 mg by mouth every 6 (six) hours as needed for moderate pain. ) 15 tablet 0 02/20/2015 at Unknown time   Assessment: 19 yo F who presented with fever, chills and L side back pain. She was hypotensive ad tachycardic. Lactate mildly elevated, WBC elevated at 29.6. +UA and suspected pyelonephritis.  Rocephin and Vancomycin were initiated.  Urine and blood cx +Ecoli and antibiotics were narrowed to Rocephin earlier today, however she has now spiked fever (102.11F) and MD has ordered for antibiotics to be broadened to Vancomycin & Zosyn .   Renal function stable at patient's baseline.   Goal of Therapy:  Vancomycin trough level 15-20 mcg/ml  Plan:  Zosyn per MD to be infused over 4 hrs per protocol Resume Vancomycin 500mg  IV q8h Check Vancomycin trough at steady state Monitor renal function and cx data   Elson Clan 02/23/2015,5:18 PM

## 2015-02-23 NOTE — Progress Notes (Signed)
Patients HR is 132, Temp is 103.1, blood cultures have been drawn, notified on-call MD will follow any new orders given and continue to monitor the patient.

## 2015-02-23 NOTE — Progress Notes (Signed)
Called [phone] and gave report to [employer] who I gave report to. Will take down by wheelchair.

## 2015-02-23 NOTE — Progress Notes (Signed)
ANTIBIOTIC CONSULT NOTE  Pharmacy Consult for Rocephin Indication: urosepsis  No Known Allergies  Patient Measurements: Height: 5\' 4"  (162.6 cm) Weight: 120 lb 13 oz (54.8 kg) IBW/kg (Calculated) : 54.7  Vital Signs: Temp: 98 F (36.7 C) (06/13 0756) Temp Source: Oral (06/13 0756) BP: 106/64 mmHg (06/13 0900) Intake/Output from previous day: 06/12 0701 - 06/13 0700 In: 4575 [P.O.:1200; I.V.:2625; IV Piggyback:750] Out: -  Intake/Output from this shift: Total I/O In: 240 [P.O.:240] Out: -   Labs:  Recent Labs  02/21/15 0356 02/22/15 0510 02/23/15 0442  WBC 28.3* 18.6* 11.3*  HGB 10.7* 10.0* 10.3*  PLT 242 224 205  CREATININE 0.61 0.50 0.53   Estimated Creatinine Clearance: 97.7 mL/min (by C-G formula based on Cr of 0.53). No results for input(s): VANCOTROUGH, VANCOPEAK, VANCORANDOM, GENTTROUGH, GENTPEAK, GENTRANDOM, TOBRATROUGH, TOBRAPEAK, TOBRARND, AMIKACINPEAK, AMIKACINTROU, AMIKACIN in the last 72 hours.   Microbiology: Recent Results (from the past 720 hour(s))  Blood culture (routine x 2)     Status: None   Collection Time: 02/21/15 12:21 AM  Result Value Ref Range Status   Specimen Description LEFT ANTECUBITAL  Final   Special Requests BOTTLES DRAWN AEROBIC AND ANAEROBIC [facility]  Final   Culture   Final    ESCHERICHIA COLI Note: Gram Stain Report Called to,Read Back By and Verified With: ROWE C 1609 02/21/15 BY THOMPSON S Performed at [facility] Performed at [facility]    Report Status 02/23/2015 FINAL  Final   Organism ID, Bacteria ESCHERICHIA COLI  Final      Susceptibility   Escherichia coli - MIC*    AMPICILLIN 8 SENSITIVE Sensitive     AMPICILLIN/SULBACTAM 8 SENSITIVE Sensitive     CEFAZOLIN <=4 SENSITIVE Sensitive     CEFEPIME <=1 SENSITIVE Sensitive     CEFTAZIDIME <=1 SENSITIVE Sensitive     CEFTRIAXONE <=1 SENSITIVE Sensitive     CIPROFLOXACIN <=0.25 SENSITIVE Sensitive     GENTAMICIN <=1 SENSITIVE Sensitive     IMIPENEM  <=0.25 SENSITIVE Sensitive     PIP/TAZO <=4 SENSITIVE Sensitive     TOBRAMYCIN <=1 SENSITIVE Sensitive     TRIMETH/SULFA <=20 SENSITIVE Sensitive     * ESCHERICHIA COLI  Blood culture (routine x 2)     Status: None (Preliminary result)   Collection Time: 02/21/15 12:27 AM  Result Value Ref Range Status   Specimen Description BLOOD RIGHT ARM  Final   Special Requests BOTTLES DRAWN AEROBIC AND ANAEROBIC 7CC  Final   Culture NO GROWTH 2 DAYS  Final   Report Status PENDING  Incomplete  Culture, Urine     Status: None (Preliminary result)   Collection Time: 02/21/15  1:16 AM  Result Value Ref Range Status   Specimen Description URINE, CLEAN CATCH  Final   Special Requests NONE  Final   Colony Count   Final    >=100,000 COLONIES/ML Performed at [facility]    Culture   Final    ESCHERICHIA COLI Performed at [facility]    Report Status PENDING  Incomplete  MRSA PCR Screening     Status: None   Collection Time: 02/21/15  3:45 AM  Result Value Ref Range Status   MRSA by PCR NEGATIVE NEGATIVE Final    Comment:        The GeneXpert MRSA Assay (FDA approved for NASAL specimens only), is one component of a comprehensive MRSA colonization surveillance program. It is not intended to diagnose MRSA infection nor to guide or  monitor treatment for MRSA infections.     Medical History: History reviewed. No pertinent past medical history.  Medications:  Prescriptions prior to admission  Medication Sig Dispense Refill Last Dose  . cyclobenzaprine (FLEXERIL) 5 MG tablet Take 1 tablet (5 mg total) by mouth 3 (three) times daily as needed for muscle spasms. 30 tablet 0 02/20/2015 at Unknown time  . ibuprofen (ADVIL,MOTRIN) 600 MG tablet Take 1 tablet (600 mg total) by mouth every 6 (six) hours as needed. (Patient taking differently: Take 600 mg by mouth every 6 (six) hours as needed (back pain). ) 30 tablet 0 02/20/2015 at Unknown time  . traMADol (ULTRAM) 50 MG tablet Take  1 tablet (50 mg total) by mouth every 6 (six) hours as needed. (Patient taking differently: Take 50 mg by mouth every 6 (six) hours as needed for moderate pain. ) 15 tablet 0 02/20/2015 at Unknown time   Assessment: 19 yo F who presented with fever, chills and L side back pain. She was hypotensive ad tachycardic. Lactate mildly elevated, WBC elevated at 29.6. +UA and suspected pyelonephritis.  Rocephin and Vancomycin were initiated.  Urine and blood cx now +Ecoli.   Patient clinically improving.   Goal of Therapy:  Vancomycin trough level 15-20 mcg/ml  Plan:  Increase Rocephin 2 GM IV every 24 hours for bacteremia No further dose adjustments anticipated- Pharmacy to sign off Duration of therapy per MD- recommend change to PO cephalosporin at discharge to complete 14 total days of antibiotics   Elson Clan 02/23/2015,10:10 AM

## 2015-02-24 DIAGNOSIS — J189 Pneumonia, unspecified organism: Secondary | ICD-10-CM | POA: Diagnosis present

## 2015-02-24 DIAGNOSIS — R1084 Generalized abdominal pain: Secondary | ICD-10-CM | POA: Diagnosis present

## 2015-02-24 DIAGNOSIS — J9601 Acute respiratory failure with hypoxia: Secondary | ICD-10-CM | POA: Diagnosis not present

## 2015-02-24 DIAGNOSIS — J90 Pleural effusion, not elsewhere classified: Secondary | ICD-10-CM | POA: Diagnosis not present

## 2015-02-24 HISTORY — DX: Acute respiratory failure with hypoxia: J96.01

## 2015-02-24 HISTORY — DX: Pneumonia, unspecified organism: J18.9

## 2015-02-24 MED ORDER — FUROSEMIDE 10 MG/ML IJ SOLN
40.0000 mg | Freq: Every day | INTRAMUSCULAR | Status: DC
  Administered 2015-02-24 – 2015-02-25 (×2): 40 mg via INTRAVENOUS
  Filled 2015-02-24 (×3): qty 4

## 2015-02-24 MED ORDER — LEVALBUTEROL HCL 0.63 MG/3ML IN NEBU
0.6300 mg | INHALATION_SOLUTION | Freq: Three times a day (TID) | RESPIRATORY_TRACT | Status: DC
  Filled 2015-02-24: qty 3

## 2015-02-24 MED ORDER — LEVALBUTEROL HCL 0.63 MG/3ML IN NEBU: 0.6300 mg | INHALATION_SOLUTION | Freq: Four times a day (QID) | RESPIRATORY_TRACT | Status: DC | PRN

## 2015-02-24 MED ORDER — MORPHINE SULFATE 2 MG/ML IJ SOLN
1.0000 mg | Freq: Once | INTRAMUSCULAR | Status: DC
  Filled 2015-02-24: qty 1

## 2015-02-24 NOTE — Progress Notes (Signed)
[facility] PROGRESS NOTE  Emmerson Shuffield ZOX:[id number] DOB: [birth year]    PCP:   No PCP Per Patient   HPI:  [name] is an 19 y.o. female admitted for urosepsis, septic shock, and bilateral pyelonephritis (CT showed the left, but she has bilateral flank pain), anemia, and hypokalemia. She is feeling better, and tolerated her hypotension quite well. I am thinking her baseline BP may be on the low side anyway given she is small. BPs have improved to 110, HR is still elevated at 110-120. She has a cough, non productive. She developed hypoxia with Sat 85%, corrected with Reserve.  Repeated CXR showed new RML and RLL PNA, with moderate effusion.  She is only a little SOB at times.   Rewiew of Systems:  Constitutional: Negative for malaise, fever and chills. No significant weight loss or weight gain Eyes: Negative for eye pain, redness and discharge, diplopia, visual changes, or flashes of light. ENMT: Negative for ear pain, hoarseness, nasal congestion, sinus pressure and sore throat. No headaches; tinnitus, drooling, or problem swallowing. Cardiovascular: Negative for chest pain, palpitations, diaphoresis, dyspnea and peripheral edema. ; No orthopnea, PND Respiratory: Negative for cough, hemoptysis, wheezing and stridor. No pleuritic chestpain. Gastrointestinal: Negative for nausea, vomiting, diarrhea, constipation, abdominal pain, melena, blood in stool, hematemesis, jaundice and rectal bleeding.    Genitourinary: Negative for frequency, dysuria, incontinence,flank pain and hematuria; Musculoskeletal: Negative for back pain and neck pain. Negative for swelling and trauma.;  Skin: . Negative for pruritus, rash, abrasions, bruising and skin lesion.; ulcerations Neuro: Negative for headache, lightheadedness and neck stiffness. Negative for weakness, altered level of consciousness , altered mental status, extremity weakness, burning feet, involuntary movement, seizure and syncope.  Psych:  negative for anxiety, depression, insomnia, tearfulness, panic attacks, hallucinations, paranoia, suicidal or homicidal ideation    History reviewed. No pertinent past medical history.  History reviewed. No pertinent past surgical history.  Medications:  HOME MEDS: Prior to Admission medications   Medication Sig Start Date End Date Taking? Authorizing Provider  cyclobenzaprine (FLEXERIL) 5 MG tablet Take 1 tablet (5 mg total) by mouth 3 (three) times daily as needed for muscle spasms. 02/18/15  Yes Burgess Amor, PA-C  ibuprofen (ADVIL,MOTRIN) 600 MG tablet Take 1 tablet (600 mg total) by mouth every 6 (six) hours as needed. Patient taking differently: Take 600 mg by mouth every 6 (six) hours as needed (back pain).  02/18/15  Yes Burgess Amor, PA-C  traMADol (ULTRAM) 50 MG tablet Take 1 tablet (50 mg total) by mouth every 6 (six) hours as needed. Patient taking differently: Take 50 mg by mouth every 6 (six) hours as needed for moderate pain.  02/18/15  Yes Burgess Amor, PA-C     Allergies:  No Known Allergies  Social History:   reports that she has been smoking.  She uses smokeless tobacco. She reports that she does not drink alcohol or use illicit drugs.  Family History: History reviewed. No pertinent family history.   Physical Exam: Filed Vitals:   02/23/15 2153 02/24/15 0307 02/24/15 0309 02/24/15 0633  BP: 117/73   121/74  Pulse: 142 110  132  Temp: 103.1 F (39.5 C) 100.1 F (37.8 C)  100 F (37.8 C)  TempSrc: Oral Oral  Oral  Resp: 28   26  Height:      Weight:      SpO2: 88% 87% 92% 91%   Blood pressure 121/74, pulse 132, temperature 100 F (37.8 C), temperature source Oral, resp. rate 26, height   (1.626 m), weight 54.8 kg (120 lb 13 oz), last menstrual period 01/26/2015, SpO2 91 %.  GEN:  Pleasant  patient lying in the stretcher in no acute distress; cooperative with exam. PSYCH:  alert and oriented x4; does not appear anxious or depressed; affect is  appropriate. HEENT: Mucous membranes pink and anicteric; PERRLA; EOM intact; no cervical lymphadenopathy nor thyromegaly or carotid bruit; no JVD; There were no stridor. Neck is very supple. Breasts:: Not examined CHEST WALL: No tenderness CHEST: Normal respiration, clear to auscultation bilaterally. No rales.  Slightly decrease BS on the right base.  HEART: Regular rate and rhythm.  There are no murmur, rub, or gallops.   BACK: No kyphosis or scoliosis; no CVA tenderness ABDOMEN: soft and non-tender; no masses, no organomegaly, normal abdominal bowel sounds; no pannus; no intertriginous candida. There is no rebound and no distention. Rectal Exam: Not done EXTREMITIES: No bone or joint deformity; age-appropriate arthropathy of the hands and knees; no edema; no ulcerations.  There is no calf tenderness. Genitalia: not examined PULSES: 2+ and symmetric SKIN: Normal hydration no rash or ulceration CNS: Cranial nerves 2-12 grossly intact no focal lateralizing neurologic deficit.  Speech is fluent; uvula elevated with phonation, facial symmetry and tongue midline. DTR are normal bilaterally, cerebella exam is intact, barbinski is negative and strengths are equaled bilaterally.  No sensory loss.   Labs on Admission:  Basic Metabolic Panel:  Recent Labs Lab 02/21/15 0014 02/21/15 0356 02/22/15 0510 02/23/15 0442 02/23/15 2246  NA 138 139 140 138 137  K 3.7 3.3* 2.9* 3.7 3.5  CL 103 109 118* 113* 109  CO2 22 21* 17* 19* 21*  GLUCOSE 95 110* 80 95 121*  BUN 5* <5*  CREATININE 0.74 0.61 0.50 0.53 0.59  CALCIUM 8.9 7.2* 7.4* 7.6* 7.7*   Liver Function Tests:  Recent Labs Lab 02/21/15 0014 02/21/15 0356 02/22/15 0510 02/23/15 0442 02/23/15 2246  AST 18 13* ALT 13* 11* ALKPHOS 53 45 74 71 75  BILITOT 0.9 0.6 0.7 0.8 0.4  PROT 7.3 5.3* 4.8* 5.0* 5.6*  ALBUMIN 3.7 2.6* 2.3* 2.1* 2.4*   No results for input(s): LIPASE, AMYLASE in the last 168 hours. No  results for input(s): AMMONIA in the last 168 hours. CBC:  Recent Labs Lab 02/21/15 0014 02/21/15 0356 02/22/15 0510 02/23/15 0442 02/23/15 2246  WBC 29.6* 28.3* 18.6* 11.3* 9.9  NEUTROABS 27.4* 23.3* 15.4* 9.1* 8.4*  HGB 12.9 10.7* 10.0* 10.3* 11.0*  HCT 38.9 33.1* 30.6* 31.4* 33.1*  MCV 95.6 96.2 96.8 95.2 93.5  PLT 305 242 224 205 241    Radiological Exams on Admission: Dg Chest 2 View  02/23/2015   CLINICAL DATA:  Hypoxia with fever and chills for 3-4 days.  EXAM: CHEST  2 VIEW  COMPARISON:  CT abdomen and pelvis and PA and lateral chest 02/21/2015.  FINDINGS: The patient has a new moderately large right pleural effusion with extensive right middle and lower lobe airspace disease. The left lung appears clear. Heart size is normal. No pneumothorax.  IMPRESSION: New right middle and lower lobe airspace disease with an associated effusion most consistent with pneumonia.   Electronically Signed   By: Drusilla Kanner M.D.   On: 02/23/2015 16:45    EKG: Independently reviewed.   Assessment/Plan Present on Admission:  . Sepsis . Hypokalemia HCAP Right Pleural effusion  PLAN:  She is doing better with her pyelonephritis.  Her urine and blood cultures showed E Coli, sen to Rocephin, and Zosyn.  She now has a multilobar PNA, with moderately large plural effusion.  Will give her some lasix.  She was given 7-8 L of IV when she was in septic shock.  Hopefully, the right pleural effusion will improve.  Do not give her IVF for her tachycardia, as it may be due to her fever, SOB, and pain.  Her K has been repleted.  Overall, she has improved markedly, with her BP holding, leukocytosis resolving, and overall feeling better.  She is still ill with bacteremia and now HCAP.  Hopefully, she will not require thoracocentesis.  Will start IV lasix daily at this time.  Other plans as per orders.  Code Status:  FULL Unk Lightning, MD. [facility] Pager [phone] 7pm to  7am.  02/24/2015, 8:30 AM

## 2015-02-24 NOTE — Progress Notes (Signed)
Patient refused breathing tx and stated "they make me feel worse after taking them".  BBS are clear, will make breathing tx's PRN according to protocol assessment.

## 2015-02-24 NOTE — Progress Notes (Signed)
Patient has reached her max dose of tylenol for 24 hrs, paged on-call MD for a new pain medication. Will follow any new orders given, and continue to monitor the patient.

## 2015-02-24 NOTE — Care Management Note (Signed)
Case Management Note  Patient Details  Name: [name] MRN: [id number] Date of Birth: [birth year]  Subjective/Objective:                    Action/Plan:   Expected Discharge Date:  02/23/15               Expected Discharge Plan:  Home/Self Care  In-House Referral:  NA  Discharge planning Services  CM Consult  Post Acute Care Choice:  NA Choice offered to:  NA  DME Arranged:    DME Agency:     HH Arranged:    HH Agency:     Status of Service:  Completed, signed off  Medicare Important Message Given:    Date Medicare IM Given:    Medicare IM give by:    Date Additional Medicare IM Given:    Additional Medicare Important Message give by:     If discussed at Long Length of Stay Meetings, dates discussed:    Additional Comments: CM offered to arrange PCP with [facility] and pt declined. Pt stated that she would call and make her own appt. Pt given the contact information for the clinic. Will continue to follow for discharge planning needs. Arlyss Queen [place], RN 02/24/2015, 3:15 PM

## 2015-02-24 NOTE — Progress Notes (Signed)
Patient still experiencing tachypnea, tachycardia and desating when she takes her O2 off. Paged the on-call MD this morning. Will pass this on to the day shift RN to follow any new orders and continue to monitor the patient.

## 2015-02-25 DIAGNOSIS — J9601 Acute respiratory failure with hypoxia: Secondary | ICD-10-CM

## 2015-02-25 DIAGNOSIS — N390 Urinary tract infection, site not specified: Secondary | ICD-10-CM

## 2015-02-25 DIAGNOSIS — B9620 Unspecified Escherichia coli [E. coli] as the cause of diseases classified elsewhere: Secondary | ICD-10-CM

## 2015-02-25 DIAGNOSIS — A4151 Sepsis due to Escherichia coli [E. coli]: Principal | ICD-10-CM

## 2015-02-25 DIAGNOSIS — N10 Acute tubulo-interstitial nephritis: Secondary | ICD-10-CM

## 2015-02-25 LAB — CBC WITH DIFFERENTIAL/PLATELET
BASOS PCT: 0 % (ref 0–1)
Basophils Absolute: 0 10*3/uL (ref 0.0–0.1)
Eosinophils Absolute: 0 10*3/uL (ref 0.0–0.7)
Eosinophils Relative: 0 % (ref 0–5)
HCT: 31.3 % — ABNORMAL LOW (ref 36.0–46.0)
Hemoglobin: 10.4 g/dL — ABNORMAL LOW (ref 12.0–15.0)
Lymphocytes Relative: 13 % (ref 12–46)
Lymphs Abs: 1 10*3/uL (ref 0.7–4.0)
MCH: 31 pg (ref 26.0–34.0)
MCHC: 33.2 g/dL (ref 30.0–36.0)
MCV: 93.4 fL (ref 78.0–100.0)
Monocytes Absolute: 1.1 10*3/uL — ABNORMAL HIGH (ref 0.1–1.0)
Monocytes Relative: 13 % — ABNORMAL HIGH (ref 3–12)
NEUTROS PCT: 74 % (ref 43–77)
Neutro Abs: 6.1 10*3/uL (ref 1.7–7.7)
Platelets: 248 10*3/uL (ref 150–400)
RBC: 3.35 MIL/uL — ABNORMAL LOW (ref 3.87–5.11)
RDW: 13.8 % (ref 11.5–15.5)
WBC: 8.2 10*3/uL (ref 4.0–10.5)

## 2015-02-25 LAB — COMPREHENSIVE METABOLIC PANEL
ALT: 25 U/L (ref 14–54)
AST: 17 U/L (ref 15–41)
Albumin: 2.3 g/dL — ABNORMAL LOW (ref 3.5–5.0)
Alkaline Phosphatase: 69 U/L (ref 38–126)
Anion gap: 9 (ref 5–15)
BUN: 8 mg/dL (ref 6–20)
CALCIUM: 7.8 mg/dL — AB (ref 8.9–10.3)
CO2: 25 mmol/L (ref 22–32)
CREATININE: 0.56 mg/dL (ref 0.44–1.00)
Chloride: 105 mmol/L (ref 101–111)
GFR calc Af Amer: >60 mL/min (ref >60)
Glucose, Bld: 94 mg/dL (ref 65–99)
Potassium: 3.6 mmol/L (ref 3.5–5.1)
SODIUM: 139 mmol/L (ref 135–145)
Total Bilirubin: 0.7 mg/dL (ref 0.3–1.2)
Total Protein: 5.5 g/dL — ABNORMAL LOW (ref 6.5–8.1)

## 2015-02-25 NOTE — Progress Notes (Signed)
[facility] PROGRESS NOTE  [name] ZRA:[id number] DOB: [birth year] DOA: 02/20/2015 PCP: No PCP Per Patient  Assessment/Plan: 1. Acute pyelonephritis. Urine cultures positive for Escherichia coli. Continue intravenous antibiotic. Fevers appear to be resolving. Clinically improving. 2. Sepsis due to Escherichia coli. We'll transition to oral antibiotic since the next 24 hours. She will likely need 2 weeks of treatment. 3. Healthcare associated pneumonia. Chest x-ray showed right middle lobe pneumonia with pleural effusion. She was started on broad-spectrum antibiotic. Likely transition to oral antibiotic tomorrow. Respirations appear to be improving. Repeat chest x-ray tomorrow. 4. Acute respiratory failure with hypoxia. Related to #3. Appears to be improving. She is now on room air. She still reports shortness of breath on exertion. She was started on intravenous Lasix for pleural effusion. We'll continue treatments for another 24 hours and repeat chest x-ray tomorrow.  Code Status: full code Family Communication: Discussed with patient Disposition Plan: Discharge home once improved   Consultants:    HPI/Subjective: Still feels short of breath, more on ambulation. Having productive cough. Still has some flank pain  Objective: Filed Vitals:   02/25/15 0530  BP: 119/85  Pulse: 97  Temp: 98.3 F (36.8 C)  Resp: 18    Intake/Output Summary (Last 24 hours) at 02/25/15 1415 Last data filed at 02/25/15 1100  Gross per 24 hour  Intake    360 ml  Output      0 ml  Net    360 ml   Filed Weights   02/21/15 0500 02/22/15 0500 02/23/15 0500  Weight: 49.5 kg (109 lb 2 oz) 54 kg (119 lb 0.8 oz) 54.8 kg (120 lb 13 oz)    Exam:   General:  NAD  Cardiovascular: S1, S2 RRR  Respiratory: diminished breath sounds at bases  Abdomen: soft, nt, nd, bs+  Musculoskeletal: no edema b/l   Data Reviewed: Basic Metabolic Panel:  Recent Labs Lab 02/21/15 0356 02/22/15 0510  02/23/15 0442 02/23/15 2246 02/25/15 0614  NA 139 140 138 137 139  K 3.3* 2.9* 3.7 3.5 3.6  CL 109 118* 113* 109 105  CO2 21* 17* 19* 21* 25  GLUCOSE 110* 80 95 121* 94  BUN 13 7 5* <5* 8  CREATININE 0.61 0.50 0.53 0.59 0.56  CALCIUM 7.2* 7.4* 7.6* 7.7* 7.8*   Liver Function Tests:  Recent Labs Lab 02/21/15 0356 02/22/15 0510 02/23/15 0442 02/23/15 2246 02/25/15 0614  AST 13* 21 17 15 17   ALT 11* 21 21 21 25   ALKPHOS 45 74 71 75 69  BILITOT 0.6 0.7 0.8 0.4 0.7  PROT 5.3* 4.8* 5.0* 5.6* 5.5*  ALBUMIN 2.6* 2.3* 2.1* 2.4* 2.3*   No results for input(s): LIPASE, AMYLASE in the last 168 hours. No results for input(s): AMMONIA in the last 168 hours. CBC:  Recent Labs Lab 02/21/15 0356 02/22/15 0510 02/23/15 0442 02/23/15 2246 02/25/15 0614  WBC 28.3* 18.6* 11.3* 9.9 8.2  NEUTROABS 23.3* 15.4* 9.1* 8.4* 6.1  HGB 10.7* 10.0* 10.3* 11.0* 10.4*  HCT 33.1* 30.6* 31.4* 33.1* 31.3*  MCV 96.2 96.8 95.2 93.5 93.4  PLT 242 224 205 241 248   Cardiac Enzymes: No results for input(s): CKTOTAL, CKMB, CKMBINDEX, TROPONINI in the last 168 hours. BNP (last 3 results) No results for input(s): BNP in the last 8760 hours.  ProBNP (last 3 results) No results for input(s): PROBNP in the last 8760 hours.  CBG: No results for input(s): GLUCAP in the last 168 hours.  Recent Results (from the past 240  hour(s))  Blood culture (routine x 2)     Status: None   Collection Time: 02/21/15 12:21 AM  Result Value Ref Range Status   Specimen Description LEFT ANTECUBITAL  Final   Special Requests BOTTLES DRAWN AEROBIC AND ANAEROBIC [facility]  Final   Culture   Final    ESCHERICHIA COLI Note: Gram Stain Report Called to,Read Back By and Verified With: ROWE C 1609 02/21/15 BY THOMPSON S Performed at [facility] Performed at [facility]    Report Status 02/23/2015 FINAL  Final   Organism ID, Bacteria ESCHERICHIA COLI  Final      Susceptibility   Escherichia coli - MIC*     AMPICILLIN 8 SENSITIVE Sensitive     AMPICILLIN/SULBACTAM 8 SENSITIVE Sensitive     CEFAZOLIN <=4 SENSITIVE Sensitive     CEFEPIME <=1 SENSITIVE Sensitive     CEFTAZIDIME <=1 SENSITIVE Sensitive     CEFTRIAXONE <=1 SENSITIVE Sensitive     CIPROFLOXACIN <=0.25 SENSITIVE Sensitive     GENTAMICIN <=1 SENSITIVE Sensitive     IMIPENEM <=0.25 SENSITIVE Sensitive     PIP/TAZO <=4 SENSITIVE Sensitive     TOBRAMYCIN <=1 SENSITIVE Sensitive     TRIMETH/SULFA <=20 SENSITIVE Sensitive     * ESCHERICHIA COLI  Blood culture (routine x 2)     Status: None (Preliminary result)   Collection Time: 02/21/15 12:27 AM  Result Value Ref Range Status   Specimen Description BLOOD RIGHT ARM RN BLC DRAW  Final   Special Requests BOTTLES DRAWN AEROBIC AND ANAEROBIC 7CC  Final   Culture NO GROWTH 4 DAYS  Final   Report Status PENDING  Incomplete  Culture, Urine     Status: None   Collection Time: 02/21/15  1:16 AM  Result Value Ref Range Status   Specimen Description URINE, CLEAN CATCH  Final   Special Requests NONE  Final   Colony Count   Final    >=100,000 COLONIES/ML Performed at [facility]    Culture   Final    ESCHERICHIA COLI Performed at [facility]    Report Status 02/23/2015 FINAL  Final   Organism ID, Bacteria ESCHERICHIA COLI  Final      Susceptibility   Escherichia coli - MIC*    AMPICILLIN 8 SENSITIVE Sensitive     CEFAZOLIN <=4 SENSITIVE Sensitive     CEFTRIAXONE <=1 SENSITIVE Sensitive     CIPROFLOXACIN <=0.25 SENSITIVE Sensitive     GENTAMICIN <=1 SENSITIVE Sensitive     LEVOFLOXACIN <=0.12 SENSITIVE Sensitive     NITROFURANTOIN <=16 SENSITIVE Sensitive     TOBRAMYCIN <=1 SENSITIVE Sensitive     TRIMETH/SULFA <=20 SENSITIVE Sensitive     PIP/TAZO <=4 SENSITIVE Sensitive     * ESCHERICHIA COLI  MRSA PCR Screening     Status: None   Collection Time: 02/21/15  3:45 AM  Result Value Ref Range Status   MRSA by PCR NEGATIVE NEGATIVE Final    Comment:        The  GeneXpert MRSA Assay (FDA approved for NASAL specimens only), is one component of a comprehensive MRSA colonization surveillance program. It is not intended to diagnose MRSA infection nor to guide or monitor treatment for MRSA infections.      Studies: Dg Chest 2 View  02/23/2015   CLINICAL DATA:  Hypoxia with fever and chills for 3-4 days.  EXAM: CHEST  2 VIEW  COMPARISON:  CT abdomen and pelvis and PA and lateral chest 02/21/2015.  FINDINGS: The patient has a new moderately large right pleural effusion with extensive right middle and lower lobe airspace disease. The left lung appears clear. Heart size is normal. No pneumothorax.  IMPRESSION: New right middle and lower lobe airspace disease with an associated effusion most consistent with pneumonia.   Electronically Signed   By: Drusilla Kanner M.D.   On: 02/23/2015 16:45    Scheduled Meds: . citalopram  20 mg Oral Daily  . furosemide  40 mg Intravenous Daily  . heparin  5,000 Units Subcutaneous 3 times per day  .  morphine injection  1 mg Intravenous Once  . nicotine  14 mg Transdermal Daily  . pantoprazole  40 mg Oral BID  . piperacillin-tazobactam  3.375 g Intravenous Q8H  . potassium chloride  40 mEq Oral Daily   Continuous Infusions:   Active Problems:   Sepsis   UTI (urinary tract infection)   Acute pyelonephritis   Hypokalemia   HCAP (healthcare-associated pneumonia)   Pleural effusion   Abdominal pain, generalized   Hypoxia    Time spent:    MEMON,JEHANZEB  [facility] Pager [phone]. If 7PM-7AM, please contact night-coverage at [email or web address], password [facility] 02/25/2015, 2:15 PM  LOS: 4 days

## 2015-02-26 ENCOUNTER — Inpatient Hospital Stay (HOSPITAL_COMMUNITY): Payer: Medicaid Other

## 2015-02-26 DIAGNOSIS — J189 Pneumonia, unspecified organism: Secondary | ICD-10-CM

## 2015-02-26 LAB — BASIC METABOLIC PANEL
Anion gap: 10 (ref 5–15)
BUN: 10 mg/dL (ref 6–20)
CO2: 26 mmol/L (ref 22–32)
Calcium: 8.4 mg/dL — ABNORMAL LOW (ref 8.9–10.3)
Chloride: 104 mmol/L (ref 101–111)
Creatinine, Ser: 0.54 mg/dL (ref 0.44–1.00)
GFR calc Af Amer: >60 mL/min (ref >60)
GFR calc non Af Amer: >60 mL/min (ref >60)
Glucose, Bld: 109 mg/dL — ABNORMAL HIGH (ref 65–99)
Potassium: 3.4 mmol/L — ABNORMAL LOW (ref 3.5–5.1)
Sodium: 140 mmol/L (ref 135–145)

## 2015-02-26 MED ORDER — PANTOPRAZOLE SODIUM 40 MG PO TBEC: 40.0000 mg | DELAYED_RELEASE_TABLET | Freq: Every day | ORAL | Status: DC

## 2015-02-26 MED ORDER — TRAMADOL HCL 50 MG PO TABS: 50.0000 mg | ORAL_TABLET | Freq: Four times a day (QID) | ORAL | Status: DC | PRN

## 2015-02-26 MED ORDER — LEVOFLOXACIN 750 MG PO TABS: 750.0000 mg | ORAL_TABLET | Freq: Every day | ORAL | Status: DC

## 2015-02-26 NOTE — Discharge Summary (Signed)
Physician Discharge Summary  [name] ZLD:[id number] DOB: 1995-09-20 DOA: 02/20/2015  PCP: No PCP Per Patient  Admit date: 02/20/2015 Discharge date: 02/26/2015  Time spent: 40 minutes  Recommendations for Outpatient Follow-up:  1. Follow up with primary care physician in 1-2 weeks  Discharge Diagnoses:  Active Problems:   E. coli sepsis   E. coli UTI   Acute pyelonephritis   Hypokalemia   HCAP (healthcare-associated pneumonia)   Pleural effusion   Abdominal pain, generalized   Acute respiratory failure with hypoxia   Discharge Condition: improved  Diet recommendation: regular diet  Filed Weights   02/21/15 0500 02/22/15 0500 02/23/15 0500  Weight: 49.5 kg (109 lb 2 oz) 54 kg (119 lb 0.8 oz) 54.8 kg (120 lb 13 oz)    History of present illness:  This patient was admitted to hospital with back pain that began 2-3 days prior to admission with associated malaise. She denied any dysuria or urinary frequency. Workup in the emergency room indicated evidence of sepsis and urinary tract infection. She was admitted for further treatments.  Hospital Course:  Patient was found to have Escherichia coli pyelonephritis as well as sepsis. She had 1 out of 2 positive blood cultures for Escherichia coli. Patient was treated with intravenous antibiotics and significantly improved. During her hospital stay, she developed shortness of breath and chest x-ray indicated developing pneumonia. She was continued on intravenous antibiotic and has since began to improve. She also developed a parapneumonic effusion which improved with intravenous Lasix. The patient is no longer short of breath and is able to ambulate without any difficulty. She is maintaining saturations greater than 90%. She is no longer febrile and her back pain has improved. She will need to complete a total of 2 weeks of antibiotic. Will place her on Levaquin which should treat pyelonephritis as well as pneumonia. Patient is otherwise  stable for discharge.  Procedures:    Consultations:    Discharge Exam: Filed Vitals:   02/26/15 0641  BP: 116/75  Pulse: 86  Temp: 98.3 F (36.8 C)  Resp: 18    General: NAD Cardiovascular: S1, s2 RRR Respiratory: CTA B  Discharge Instructions   Discharge Instructions    Diet general    Complete by:  As directed      Increase activity slowly    Complete by:  As directed           Current Discharge Medication List    START taking these medications   Details  levofloxacin (LEVAQUIN) 750 MG tablet Take 1 tablet (750 mg total) by mouth daily. Qty: 7 tablet, Refills: 0    pantoprazole (PROTONIX) 40 MG tablet Take 1 tablet (40 mg total) by mouth daily. Qty: 30 tablet, Refills: 1      CONTINUE these medications which have CHANGED   Details  traMADol (ULTRAM) 50 MG tablet Take 1 tablet (50 mg total) by mouth every 6 (six) hours as needed. Qty: 15 tablet, Refills: 0      CONTINUE these medications which have NOT CHANGED   Details  cyclobenzaprine (FLEXERIL) 5 MG tablet Take 1 tablet (5 mg total) by mouth 3 (three) times daily as needed for muscle spasms. Qty: 30 tablet, Refills: 0    ibuprofen (ADVIL,MOTRIN) 600 MG tablet Take 1 tablet (600 mg total) by mouth every 6 (six) hours as needed. Qty: 30 tablet, Refills: 0       No Known Allergies    The results of significant diagnostics from this hospitalization (including  imaging, microbiology, ancillary and laboratory) are listed below for reference.    Significant Diagnostic Studies: Dg Chest 2 View  02/26/2015   CLINICAL DATA:  Pneumonia.  Smoker.  EXAM: CHEST  2 VIEW  COMPARISON:  02/23/2015 and 02/21/2015  FINDINGS: Lungs are adequately inflated with moderate interval improvement in the right-sided effusion and airspace process. There is only a small residual amount of right pleural fluid noted as well as minimal residual airspace disease over the right middle lobe and possibly lingula. Possible small  amount left pleural fluid. Cardiomediastinal silhouette and remainder of the exam is unchanged.  IMPRESSION: Moderate interval improvement in patient's right-sided effusion and airspace process with small residual right pleural effusion and minimal residual right middle lobe airspace density. Persistent small amount left pleural fluid and lingular airspace density.   Electronically Signed   By: Elberta Fortis M.D.   On: 02/26/2015 09:09   Dg Chest 2 View  02/23/2015   CLINICAL DATA:  Hypoxia with fever and chills for 3-4 days.  EXAM: CHEST  2 VIEW  COMPARISON:  CT abdomen and pelvis and PA and lateral chest 02/21/2015.  FINDINGS: The patient has a new moderately large right pleural effusion with extensive right middle and lower lobe airspace disease. The left lung appears clear. Heart size is normal. No pneumothorax.  IMPRESSION: New right middle and lower lobe airspace disease with an associated effusion most consistent with pneumonia.   Electronically Signed   By: Drusilla Kanner M.D.   On: 02/23/2015 16:45   Dg Chest 2 View  02/21/2015   CLINICAL DATA:  Acute onset of fever and chills. Vomiting and severe headache. Back pain for 4 days. Initial encounter.  EXAM: CHEST  2 VIEW  COMPARISON:  None.  FINDINGS: The lungs are well-aerated and clear. There is no evidence of focal opacification, pleural effusion or pneumothorax.  The heart is normal in size; the mediastinal contour is within normal limits. No acute osseous abnormalities are seen.  IMPRESSION: No acute cardiopulmonary process seen.   Electronically Signed   By: Roanna Raider M.D.   On: 02/21/2015 01:38   Dg Lumbar Spine Complete  02/18/2015   CLINICAL DATA:  19 year old female with lumbar back pain radiating to the left lower extremity, acute onset with no known injury. Initial encounter.  EXAM: LUMBAR SPINE - COMPLETE 4+ VIEW  COMPARISON:  None.  FINDINGS: Normal lumbar segmentation. Bone mineralization is within normal limits. Normal vertebral  height and alignment. Preserved disc spaces. No pars fracture. Sacral ala and SI joints appear within normal limits. Visible lower thoracic levels appear unremarkable.  IMPRESSION: Negative radiographic appearance of the lumbar spine.   Electronically Signed   By: Odessa Fleming M.D.   On: 02/18/2015 13:30   Ct Abdomen Pelvis W Contrast  02/21/2015   CLINICAL DATA:  Fever chills and back pain for 2 days.  EXAM: CT ABDOMEN AND PELVIS WITH CONTRAST  TECHNIQUE: Multidetector CT imaging of the abdomen and pelvis was performed using the standard protocol following bolus administration of intravenous contrast.  CONTRAST:  50mL OMNIPAQUE IOHEXOL 300 MG/ML SOLN, OMNIPAQUE IOHEXOL 300 MG/ML SOLN  COMPARISON:  None.  FINDINGS: There is moderate periportal edema and pericholecystic fluid. This is a nonspecific finding. There also is a small volume free fluid collected dependently in the pelvis. No focal liver lesions are evident. There is no bile duct dilatation. There are normal appearances of the spleen, pancreas, and adrenals.  There are multiple wedge-shaped areas of hypodensity in  the left kidney, extending from the medulla out to the edge of the cortex. This suggests pyelonephritis. There is no hydronephrosis. There is no ureteral dilatation. The right kidney is normal.  Bowel and mesentery appear unremarkable. Minimal peripheral opacities are present in the lung bases, perhaps atelectatic.  The uterus and ovaries appear unremarkable.  No significant musculoskeletal abnormality is evident.  IMPRESSION: 1. Abnormal left kidney, with appearances suggesting pyelonephritis. No hydronephrosis or ureteral dilatation. 2. Hepatic periportal edema and pericholecystic fluid, nonspecific.   Electronically Signed   By: Ellery Plunk M.D.   On: 02/21/2015 05:39    Microbiology: Recent Results (from the past 240 hour(s))  Blood culture (routine x 2)     Status: None   Collection Time: 02/21/15 12:21 AM  Result Value Ref  Range Status   Specimen Description LEFT ANTECUBITAL  Final   Special Requests BOTTLES DRAWN AEROBIC AND ANAEROBIC [facility]  Final   Culture   Final    ESCHERICHIA COLI Note: Gram Stain Report Called to,Read Back By and Verified With: ROWE C 1609 02/21/15 BY THOMPSON S Performed at [facility] Performed at [facility]    Report Status 02/23/2015 FINAL  Final   Organism ID, Bacteria ESCHERICHIA COLI  Final      Susceptibility   Escherichia coli - MIC*    AMPICILLIN 8 SENSITIVE Sensitive     AMPICILLIN/SULBACTAM 8 SENSITIVE Sensitive     CEFAZOLIN <=4 SENSITIVE Sensitive     CEFEPIME <=1 SENSITIVE Sensitive     CEFTAZIDIME <=1 SENSITIVE Sensitive     CEFTRIAXONE <=1 SENSITIVE Sensitive     CIPROFLOXACIN <=0.25 SENSITIVE Sensitive     GENTAMICIN <=1 SENSITIVE Sensitive     IMIPENEM <=0.25 SENSITIVE Sensitive     PIP/TAZO <=4 SENSITIVE Sensitive     TOBRAMYCIN <=1 SENSITIVE Sensitive     TRIMETH/SULFA <=20 SENSITIVE Sensitive     * ESCHERICHIA COLI  Blood culture (routine x 2)     Status: None (Preliminary result)   Collection Time: 02/21/15 12:27 AM  Result Value Ref Range Status   Specimen Description BLOOD RIGHT ARM RN BLC DRAW  Final   Special Requests BOTTLES DRAWN AEROBIC AND ANAEROBIC 7CC  Final   Culture NO GROWTH 4 DAYS  Final   Report Status PENDING  Incomplete  Culture, Urine     Status: None   Collection Time: 02/21/15  1:16 AM  Result Value Ref Range Status   Specimen Description URINE, CLEAN CATCH  Final   Special Requests NONE  Final   Colony Count   Final    >=100,000 COLONIES/ML Performed at [facility]    Culture   Final    ESCHERICHIA COLI Performed at [facility]    Report Status 02/23/2015 FINAL  Final   Organism ID, Bacteria ESCHERICHIA COLI  Final      Susceptibility   Escherichia coli - MIC*    AMPICILLIN 8 SENSITIVE Sensitive     CEFAZOLIN <=4 SENSITIVE Sensitive     CEFTRIAXONE <=1 SENSITIVE Sensitive      CIPROFLOXACIN <=0.25 SENSITIVE Sensitive     GENTAMICIN <=1 SENSITIVE Sensitive     LEVOFLOXACIN <=0.12 SENSITIVE Sensitive     NITROFURANTOIN <=16 SENSITIVE Sensitive     TOBRAMYCIN <=1 SENSITIVE Sensitive     TRIMETH/SULFA <=20 SENSITIVE Sensitive     PIP/TAZO <=4 SENSITIVE Sensitive     * ESCHERICHIA COLI  MRSA PCR Screening     Status: None   Collection Time:  02/21/15  3:45 AM  Result Value Ref Range Status   MRSA by PCR NEGATIVE NEGATIVE Final    Comment:        The GeneXpert MRSA Assay (FDA approved for NASAL specimens only), is one component of a comprehensive MRSA colonization surveillance program. It is not intended to diagnose MRSA infection nor to guide or monitor treatment for MRSA infections.      Labs: Basic Metabolic Panel:  Recent Labs Lab 02/22/15 0510 02/23/15 0442 02/23/15 2246 02/25/15 0614 02/26/15 0618  NA 140 138 137 139 140  K 2.9* 3.7 3.5 3.6 3.4*  CL 118* 113* 109 105 104  CO2 17* 19* 21* 25 26  GLUCOSE 80 95 121* 94 109*  BUN 7 5* <5* 8 10  CREATININE 0.50 0.53 0.59 0.56 0.54  CALCIUM 7.4* 7.6* 7.7* 7.8* 8.4*   Liver Function Tests:  Recent Labs Lab 02/21/15 0356 02/22/15 0510 02/23/15 0442 02/23/15 2246 02/25/15 0614  AST 13* ALT 11* ALKPHOS 45 74 71 75 69  BILITOT 0.6 0.7 0.8 0.4 0.7  PROT 5.3* 4.8* 5.0* 5.6* 5.5*  ALBUMIN 2.6* 2.3* 2.1* 2.4* 2.3*   No results for input(s): LIPASE, AMYLASE in the last 168 hours. No results for input(s): AMMONIA in the last 168 hours. CBC:  Recent Labs Lab 02/21/15 0356 02/22/15 0510 02/23/15 0442 02/23/15 2246 02/25/15 0614  WBC 28.3* 18.6* 11.3* 9.9 8.2  NEUTROABS 23.3* 15.4* 9.1* 8.4* 6.1  HGB 10.7* 10.0* 10.3* 11.0* 10.4*  HCT 33.1* 30.6* 31.4* 33.1* 31.3*  MCV 96.2 96.8 95.2 93.5 93.4  PLT 242 224 205 241 248   Cardiac Enzymes: No results for input(s): CKTOTAL, CKMB, CKMBINDEX, TROPONINI in the last 168 hours. BNP: BNP (last 3 results) No  results for input(s): BNP in the last 8760 hours.  ProBNP (last 3 results) No results for input(s): PROBNP in the last 8760 hours.  CBG: No results for input(s): GLUCAP in the last 168 hours.     Signed:  MEMON,JEHANZEB  [facility] 02/26/2015, 11:38 AM

## 2015-02-26 NOTE — Progress Notes (Addendum)
SATURATION QUALIFICATIONS: (This note is used to comply with regulatory documentation for home oxygen)  Patient Saturations on Room Air at Rest = 97%  Patient Saturations on Room Air while Ambulating = 94%  Patient ambulated well with no assistance. Patient had no complaints while ambulating and had no shortness of breath.

## 2015-02-26 NOTE — Care Management Note (Signed)
Case Management Note  Patient Details  Name: [name] MRN: [id number] Date of Birth: [birth year]  Subjective/Objective:                    Action/Plan:   Expected Discharge Date:  02/23/15               Expected Discharge Plan:  Home/Self Care  In-House Referral:  NA  Discharge planning Services  CM Consult  Post Acute Care Choice:  NA Choice offered to:  NA  DME Arranged:    DME Agency:     HH Arranged:    HH Agency:     Status of Service:  Completed, signed off  Medicare Important Message Given:    Date Medicare IM Given:    Medicare IM give by:    Date Additional Medicare IM Given:    Additional Medicare Important Message give by:     If discussed at Long Length of Stay Meetings, dates discussed:    Additional Comments: Pt discharged home today. Pt does not want CM to arrange post hospital follow up. Pt stated she would do it and information given to pt on [facility] No further CM needs noted. Arlyss [place], RN 02/26/2015, 12:10 PM

## 2015-02-26 NOTE — Progress Notes (Signed)
[name] discharged home with boyfriend per MD order.  Discharge instructions reviewed and discussed with the patient, all questions and concerns answered. Copy of instructions and scripts given to patient.    Medication List    TAKE these medications        cyclobenzaprine 5 MG tablet  Commonly known as:  FLEXERIL  Take 1 tablet (5 mg total) by mouth 3 (three) times daily as needed for muscle spasms.     ibuprofen 600 MG tablet  Commonly known as:  ADVIL,MOTRIN  Take 1 tablet (600 mg total) by mouth every 6 (six) hours as needed.     levofloxacin 750 MG tablet  Commonly known as:  LEVAQUIN  Take 1 tablet (750 mg total) by mouth daily.     pantoprazole 40 MG tablet  Commonly known as:  PROTONIX  Take 1 tablet (40 mg total) by mouth daily.     traMADol 50 MG tablet  Commonly known as:  ULTRAM  Take 1 tablet (50 mg total) by mouth every 6 (six) hours as needed.        Patients skin is clean, dry and intact, no evidence of skin break down. IV site discontinued and catheter remains intact. Site without signs and symptoms of complications. Dressing and pressure applied.  Patient escorted to car by Alexis,NT in a wheelchair,  no distress noted upon discharge.  Ubaldo Glassing 02/26/2015 12:43 PM

## 2015-03-02 LAB — CULTURE, BLOOD (ROUTINE X 2): Culture: NO GROWTH

## 2015-04-16 ENCOUNTER — Encounter (HOSPITAL_COMMUNITY): Payer: Self-pay | Admitting: Registered Nurse

## 2015-10-02 ENCOUNTER — Encounter (HOSPITAL_COMMUNITY): Payer: Self-pay

## 2015-10-02 ENCOUNTER — Emergency Department (HOSPITAL_COMMUNITY)
Admission: EM | Admit: 2015-10-02 | Discharge: 2015-10-02 | Disposition: A | Discharge status: Home / Self Care | Payer: Medicaid Other | Attending: Emergency Medicine | Admitting: Emergency Medicine

## 2015-10-02 DIAGNOSIS — N390 Urinary tract infection, site not specified: Secondary | ICD-10-CM | POA: Diagnosis not present

## 2015-10-02 DIAGNOSIS — F17210 Nicotine dependence, cigarettes, uncomplicated: Secondary | ICD-10-CM | POA: Diagnosis not present

## 2015-10-02 DIAGNOSIS — Z3202 Encounter for pregnancy test, result negative: Secondary | ICD-10-CM | POA: Diagnosis not present

## 2015-10-02 DIAGNOSIS — R109 Unspecified abdominal pain: Secondary | ICD-10-CM | POA: Diagnosis present

## 2015-10-02 LAB — URINALYSIS, ROUTINE W REFLEX MICROSCOPIC
Bilirubin Urine: NEGATIVE
GLUCOSE, UA: NEGATIVE mg/dL
KETONES UR: NEGATIVE mg/dL
Nitrite: POSITIVE — AB
Specific Gravity, Urine: 1.02 (ref 1.005–1.030)
pH: 6 (ref 5.0–8.0)

## 2015-10-02 LAB — URINE MICROSCOPIC-ADD ON

## 2015-10-02 LAB — PREGNANCY, URINE: PREG TEST UR: NEGATIVE

## 2015-10-02 MED ORDER — CEPHALEXIN 500 MG PO CAPS: 500.0000 mg | ORAL_CAPSULE | Freq: Four times a day (QID) | ORAL | Status: DC

## 2015-10-02 MED ORDER — CEPHALEXIN 500 MG PO CAPS
500.0000 mg | ORAL_CAPSULE | Freq: Once | ORAL | Status: AC
  Administered 2015-10-02: 500 mg via ORAL
  Filled 2015-10-02: qty 1

## 2015-10-02 NOTE — ED Provider Notes (Signed)
CSN: [id number]     Arrival date & time 10/02/15  7829 History  By signing my name below, I, [name], attest that this documentation has been prepared under the direction and in the presence of No att. providers found. Electronically Signed: [name], ED Scribe. 10/02/2015. 10:48 AM.  Chief Complaint  Patient presents with  . Flank Pain   The history is provided by the patient. No language interpreter was used.   PCP: No PCP Per Patient HPI Comments: [name] is a 20 y.o. female who presents to the Emergency Department complaining of right sided flank pain with associated dysuria onset last night. Pt reports her last UTI was 6 months ago. Pt denies fever, n/v/d, rash, abd pain, vaginal discharge or any other Sx at this time. Pt further denies exposure to sick contacts.   Past Medical History  Diagnosis Date  . Medical history non-contributory    Past Surgical History  Procedure Laterality Date  . No past surgeries     Family History  Problem Relation Age of Onset  . Cancer Maternal Grandmother    Social History  Substance Use Topics  . Smoking status: Current Every Day Smoker -- 0.50 packs/day for 5 years    Types: Cigarettes  . Smokeless tobacco: Current User  . Alcohol Use: No   OB History    Gravida Para Term Preterm AB TAB SAB Ectopic Multiple Living   0 0 0 0 0  1     Review of Systems  Constitutional: Negative for fever.  Gastrointestinal: Negative for nausea, vomiting, abdominal pain and diarrhea.  Genitourinary: Positive for dysuria and flank pain. Negative for vaginal discharge.  Skin: Negative for rash.   Allergies  Review of patient's allergies indicates no known allergies.  Home Medications   Prior to Admission medications   Medication Sig Start Date End Date Taking? Authorizing Provider  ibuprofen (ADVIL,MOTRIN) 200 MG tablet Take 200 mg by mouth every 6 (six) hours as needed for mild pain or moderate pain.   Yes Historical Provider,  MD  cephALEXin (KEFLEX) 500 MG capsule Take 1 capsule (500 mg total) by mouth 4 (four) times daily. 10/02/15   Marily Memos, MD   Triage Vitals: BP 108/70 mmHg  Pulse 94  Temp(Src) 99 F (37.2 C) (Oral)  Resp 18  Ht  (1.549 m)  Wt 108 lb (48.988 kg)  BMI 20.42 kg/m2  SpO2 100%  LMP 09/23/2015 Physical Exam  Constitutional: She is oriented to person, place, and time. She appears well-developed and well-nourished.  HENT:  Head: Normocephalic.  Eyes: EOM are normal.  Neck: Normal range of motion.  Pulmonary/Chest: Effort normal.  Abdominal: She exhibits no distension. There is CVA tenderness (right).  Musculoskeletal: Normal range of motion.  Neurological: She is alert and oriented to person, place, and time.  Psychiatric: She has a normal mood and affect.  Nursing note and vitals reviewed.   ED Course  Procedures (including critical care time) DIAGNOSTIC STUDIES: Oxygen Saturation is 100% on ra, nl by my interpretation.    COORDINATION OF CARE: 10:32 AM: Discussed treatment plan which includes UA with pt at bedside; patient verbalizes understanding and agrees with treatment plan.  Labs Review Labs Reviewed  URINALYSIS, ROUTINE W REFLEX MICROSCOPIC (NOT AT [facility]) - Abnormal; Notable for the following:    APPearance HAZY (*)    Hgb urine dipstick SMALL (*)    Protein, ur TRACE (*)    Nitrite POSITIVE (*)  Leukocytes, UA MODERATE (*)    All other components within normal limits  URINE MICROSCOPIC-ADD ON - Abnormal; Notable for the following:    Squamous Epithelial / LPF 0-5 (*)    Bacteria, UA MANY (*)    All other components within normal limits  PREGNANCY, URINE    Imaging Review No results found. I have personally reviewed and evaluated these lab results as part of my medical decision-making.   MDM   Final diagnoses:  UTI (lower urinary tract infection)    UTI v early pyelonephritis. No e/o sepsis. Will tx w/ keflex and return precautison. Urine  culture added on.   I personally performed the services described in this documentation, which was scribed in my presence. The recorded information has been reviewed and is accurate.    Marily Memos, MD 10/02/15 [phone]

## 2015-10-02 NOTE — ED Notes (Signed)
Pt reports r flank pain and pain after urinating since last night.   Reports has history of UTI that felt similar 6 months ago.

## 2016-10-23 IMAGING — DX DG CHEST 2V
2 series · 2 of 2 positions shown · non-contrast
Comparison: None.

CLINICAL DATA: Acute onset of fever and chills. Vomiting and severe
headache. Back pain for 4 days. Initial encounter.

EXAM:
CHEST  2 VIEW

[chest pa]
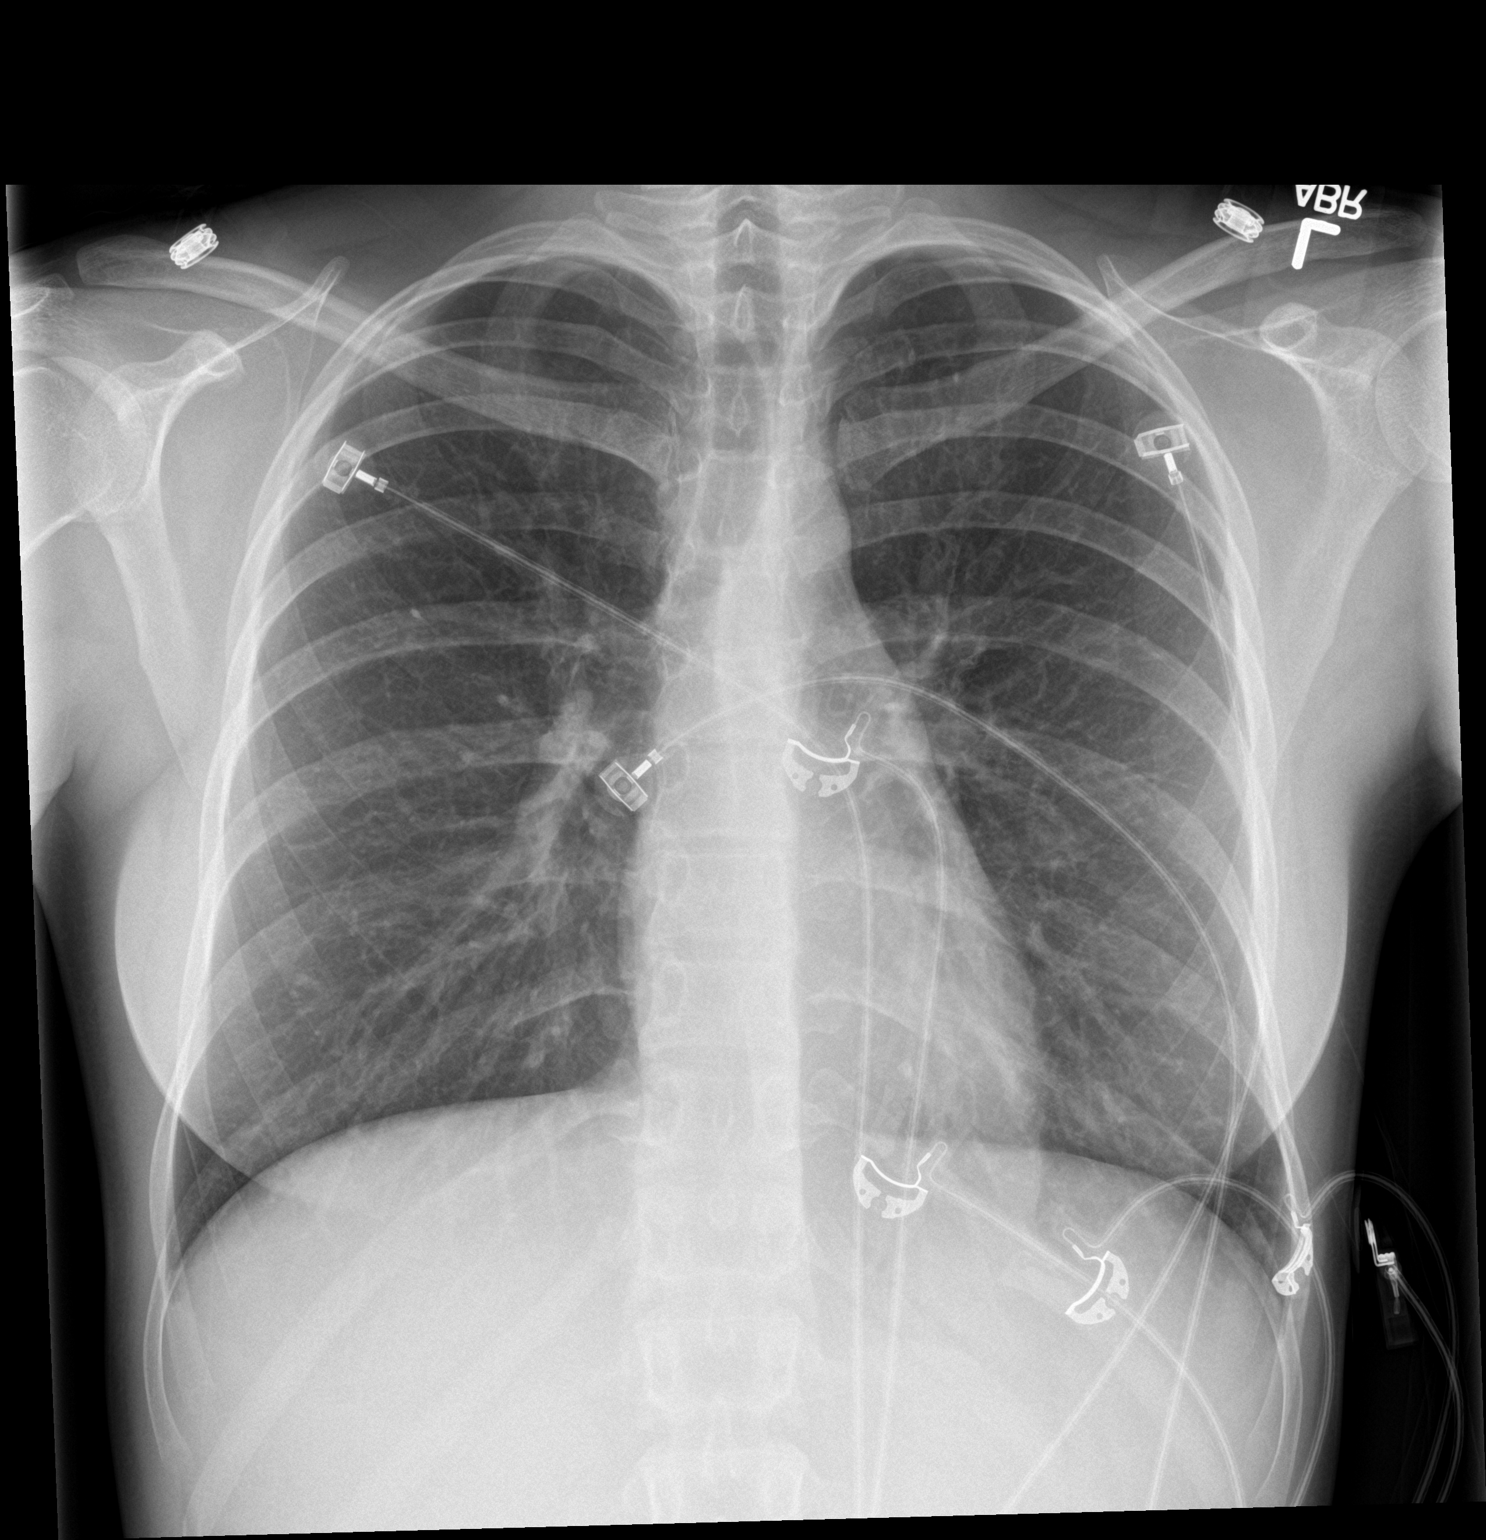

[chest lat]
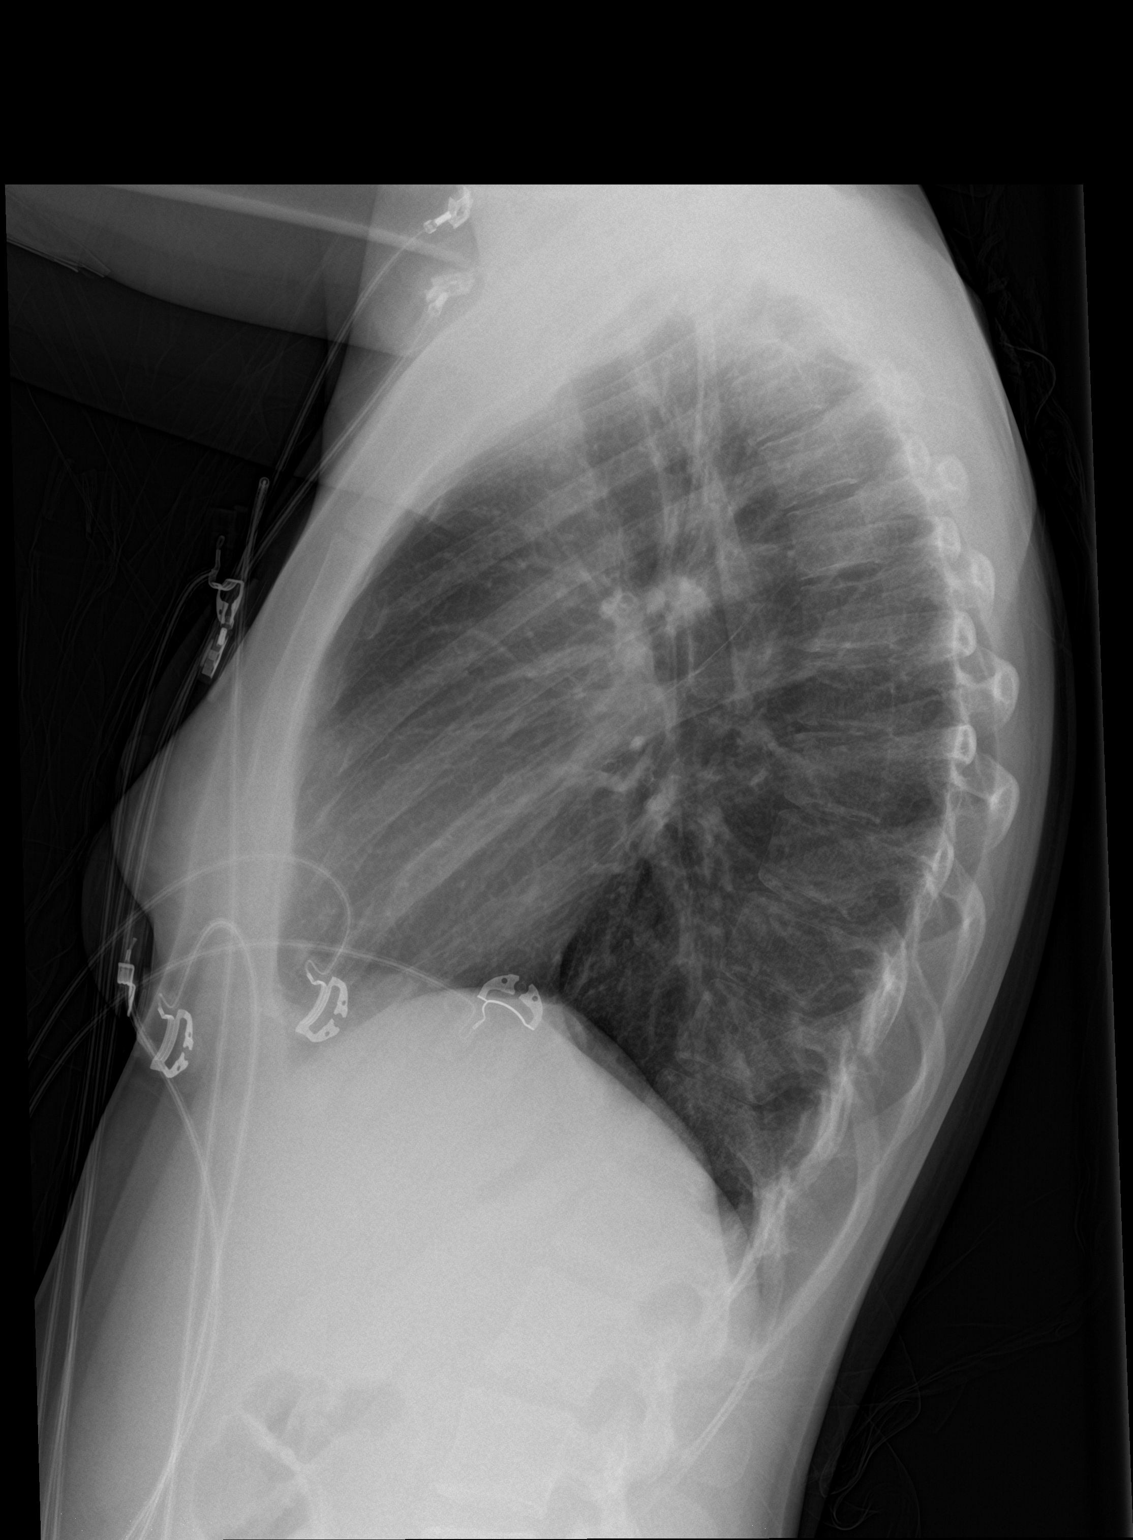

[2 of 2 positions shown; findings below may reference images not displayed]

FINDINGS: The lungs are well-aerated and clear. There is no evidence of focal
opacification, pleural effusion or pneumothorax.

The heart is normal in size; the mediastinal contour is within
normal limits. No acute osseous abnormalities are seen.
IMPRESSION: No acute cardiopulmonary process seen.

## 2017-04-16 ENCOUNTER — Emergency Department (HOSPITAL_COMMUNITY)
Admission: EM | Admit: 2017-04-16 | Discharge: 2017-04-16 | Discharge status: Against Medical Advice | Payer: Medicaid Other

## 2017-04-16 NOTE — ED Notes (Signed)
No answer in lobby when called.

## 2017-10-17 ENCOUNTER — Ambulatory Visit: Payer: Self-pay | Admitting: Family Medicine

## 2017-11-06 ENCOUNTER — Emergency Department (HOSPITAL_COMMUNITY)
Admission: EM | Admit: 2017-11-06 | Discharge: 2017-11-06 | Disposition: A | Discharge status: Home / Self Care | Payer: Medicaid Other

## 2017-11-06 NOTE — ED Notes (Signed)
Pt called from waiting room x1. No answer

## 2017-11-06 NOTE — ED Notes (Signed)
Pt called from waiting room x2. No answer

## 2017-11-06 NOTE — ED Notes (Signed)
Pt not in waiting room

## 2018-06-04 ENCOUNTER — Encounter: Payer: Medicaid Other | Admitting: Adult Health

## 2018-06-04 ENCOUNTER — Encounter: Payer: Self-pay | Admitting: *Deleted

## 2019-09-23 ENCOUNTER — Telehealth: Payer: Self-pay | Admitting: Adult Health

## 2019-09-23 NOTE — Telephone Encounter (Signed)
Tried to reach the patient to remind her of her appointment/restrictions, call can not be completed at this time. °

## 2019-09-24 ENCOUNTER — Encounter: Payer: Medicaid Other | Admitting: Adult Health

## 2021-02-18 ENCOUNTER — Other Ambulatory Visit: Payer: Self-pay

## 2021-02-18 ENCOUNTER — Ambulatory Visit (INDEPENDENT_AMBULATORY_CARE_PROVIDER_SITE_OTHER): Payer: Medicaid Other | Admitting: Advanced Practice Midwife

## 2021-02-18 ENCOUNTER — Encounter: Payer: Self-pay | Admitting: Advanced Practice Midwife

## 2021-02-18 VITALS — BP 114/73 | HR 105 | Ht 61.0 in | Wt 134.0 lb

## 2021-02-18 DIAGNOSIS — F1990 Other psychoactive substance use, unspecified, uncomplicated: Secondary | ICD-10-CM | POA: Diagnosis not present

## 2021-02-18 DIAGNOSIS — O0991 Supervision of high risk pregnancy, unspecified, first trimester: Secondary | ICD-10-CM | POA: Diagnosis not present

## 2021-02-18 DIAGNOSIS — N926 Irregular menstruation, unspecified: Secondary | ICD-10-CM | POA: Diagnosis not present

## 2021-02-18 LAB — POCT URINE PREGNANCY: Preg Test, Ur: POSITIVE — AB

## 2021-02-18 MED ORDER — PNV PRENATAL PLUS MULTIVITAMIN 27-1 MG PO TABS: 1.0000 | ORAL_TABLET | Freq: Every day | ORAL | 11 refills | Status: DC

## 2021-02-18 NOTE — Progress Notes (Signed)
[facility] Visit  Patient name: [name] MRN [id number]  Date of birth: 1996-06-08  CC & HPI:  [name] is a 25 y.o. Caucasian female presenting today for + UPT.  Patient's last menstrual period was 12/30/2020 (approximate). Active heroin user.  Has plans to go to a 7 day detox soon.  Plans to try to get into a housing for outpt treatment.  Mom here and supportive.   Pertinent History Reviewed:  Medical & Surgical Hx:   Past Medical History:  Diagnosis Date   Medical history non-contributory    Past Surgical History:  Procedure Laterality Date   NO PAST SURGERIES     Family History  Problem Relation Age of Onset   Stroke Maternal Grandmother    Hypertension Maternal Grandmother    COPD Maternal Grandmother    Prostate cancer Maternal Grandfather    Hypertension Mother     Current Outpatient Medications:    acetaminophen (TYLENOL) 500 MG tablet, Take 500 mg by mouth every 6 (six) hours as needed., Disp: , Rfl:    Prenatal Vit-Fe Fumarate-FA (PNV PRENATAL PLUS MULTIVITAMIN) 27-1 MG TABS, Take 1 tablet by mouth daily., Disp: 30 tablet, Rfl: 11 Social History: Reviewed -  reports that she has been smoking cigarettes. She has a 2.50 pack-year smoking history. She has never used smokeless tobacco.  Review of Systems:   Constitutional: Negative for fever and chills Eyes: Negative for visual disturbances Respiratory: Negative for shortness of breath, dyspnea Cardiovascular: Negative for chest pain or palpitations  Gastrointestinal: Negative for vomiting, diarrhea and constipation; no abdominal pain Genitourinary: Negative for dysuria and urgency, vaginal irritation or itching Musculoskeletal: Negative for back pain, joint pain, myalgias  Neurological: Negative for dizziness and headaches    Objective Findings:    Physical Examination: Vitals:   02/18/21 1530  BP: 114/73  Pulse: (!) 105   General appearance - well appearing, and in no distress Mental  status - alert, oriented to person, place, and time Chest:  Normal respiratory effort Heart - normal rate and regular rhythm Abdomen:  Soft, nontender Pelvic: deferred Musculoskeletal:  Normal range of motion without pain Extremities:  No edema    Results for orders placed or performed in visit on 02/18/21 (from the past 24 hour(s))  POCT urine pregnancy   Collection Time: 02/18/21  3:29 PM  Result Value Ref Range   Preg Test, Ur Positive (A) Negative      Assessment & Plan:  A:   Early pregnancy  Substance use disorder, heroin/opiates P: Attend Detox as planned  Pt informed that suboxone therapy is available.  Strongly encourage NA meetings/therapy. Declines referral at this time  Dating [place] ASAP     Return for asap for dating ultrasound; New OB with Tish.  Jacklyn Shell CNM 02/18/2021 7:45 PM

## 2021-03-24 ENCOUNTER — Other Ambulatory Visit: Payer: Self-pay | Admitting: Obstetrics & Gynecology

## 2021-03-24 DIAGNOSIS — O3680X0 Pregnancy with inconclusive fetal viability, not applicable or unspecified: Secondary | ICD-10-CM

## 2021-03-25 ENCOUNTER — Ambulatory Visit: Payer: Medicaid Other | Admitting: *Deleted

## 2021-03-25 ENCOUNTER — Other Ambulatory Visit: Payer: Self-pay | Admitting: Obstetrics & Gynecology

## 2021-03-25 ENCOUNTER — Other Ambulatory Visit: Payer: Medicaid Other

## 2021-03-25 ENCOUNTER — Encounter: Payer: Self-pay | Admitting: Advanced Practice Midwife

## 2021-03-25 ENCOUNTER — Other Ambulatory Visit: Payer: Self-pay

## 2021-03-25 ENCOUNTER — Ambulatory Visit (INDEPENDENT_AMBULATORY_CARE_PROVIDER_SITE_OTHER): Payer: Medicaid Other

## 2021-03-25 ENCOUNTER — Ambulatory Visit (INDEPENDENT_AMBULATORY_CARE_PROVIDER_SITE_OTHER): Payer: Medicaid Other | Admitting: Advanced Practice Midwife

## 2021-03-25 VITALS — BP 119/73 | HR 100 | Wt 129.0 lb

## 2021-03-25 DIAGNOSIS — O3680X0 Pregnancy with inconclusive fetal viability, not applicable or unspecified: Secondary | ICD-10-CM

## 2021-03-25 DIAGNOSIS — O3110X0 Continuing pregnancy after spontaneous abortion of one fetus or more, unspecified trimester, not applicable or unspecified: Secondary | ICD-10-CM

## 2021-03-25 DIAGNOSIS — O0991 Supervision of high risk pregnancy, unspecified, first trimester: Secondary | ICD-10-CM

## 2021-03-25 DIAGNOSIS — Z3A12 12 weeks gestation of pregnancy: Secondary | ICD-10-CM

## 2021-03-25 DIAGNOSIS — Z113 Encounter for screening for infections with a predominantly sexual mode of transmission: Secondary | ICD-10-CM | POA: Diagnosis not present

## 2021-03-25 DIAGNOSIS — Z1379 Encounter for other screening for genetic and chromosomal anomalies: Secondary | ICD-10-CM | POA: Diagnosis not present

## 2021-03-25 DIAGNOSIS — Z3682 Encounter for antenatal screening for nuchal translucency: Secondary | ICD-10-CM

## 2021-03-25 DIAGNOSIS — F1990 Other psychoactive substance use, unspecified, uncomplicated: Secondary | ICD-10-CM

## 2021-03-25 NOTE — Patient Instructions (Signed)
Lucendia Herrlich, I greatly value your feedback.  If you receive a survey following your visit with [place] today, we appreciate you taking the time to fill it out.  Thanks, Cathie Beams, DNP, CNM  [facility] HAS MOVED!!! It is now [facility] at [facility] ([address] [place], [place] [address]) Entrance located off of [address] Free 24/7 valet parking   Nausea & Vomiting Have saltine crackers or pretzels by your bed and eat a few bites before you raise your head out of bed in the morning Eat small frequent meals throughout the day instead of large meals Drink plenty of fluids throughout the day to stay hydrated, just don't drink a lot of fluids with your meals.  This can make your stomach fill up faster making you feel sick Do not brush your teeth right after you eat Products with real ginger are good for nausea, like ginger ale and ginger hard candy Make sure it says made with real ginger! Sucking on sour candy like lemon heads is also good for nausea If your prenatal vitamins make you nauseated, take them at night so you will sleep through the nausea Sea Bands If you feel like you need medicine for the nausea & vomiting please let us know If you are unable to keep any fluids or food down please let us know   Constipation Drink plenty of fluid, preferably water, throughout the day Eat foods high in fiber such as fruits, vegetables, and grains Exercise, such as walking, is a good way to keep your bowels regular Drink warm fluids, especially warm prune juice, or decaf coffee Eat a 1/2 cup of real oatmeal (not instant), 1/2 cup applesauce, and 1/2-1 cup warm prune juice every day If needed, you may take Colace (docusate sodium) stool softener once or twice a day to help keep the stool soft.  If you still are having problems with constipation, you may take Miralax once daily as needed to help keep your bowels regular.   Home Blood Pressure Monitoring for  Patients   Your provider has recommended that you check your blood pressure (BP) at least once a week at home. If you do not have a blood pressure cuff at home, one will be provided for you. Contact your provider if you have not received your monitor within 1 week.   Helpful Tips for Accurate Home Blood Pressure Checks  Don't smoke, exercise, or drink caffeine 30 minutes before checking your BP Use the restroom before checking your BP (a full bladder can raise your pressure) Relax in a comfortable upright chair Feet on the ground Left arm resting comfortably on a flat surface at the level of your heart Legs uncrossed Back supported Sit quietly and don't talk Place the cuff on your bare arm Adjust snuggly, so that only two fingertips can fit between your skin and the top of the cuff Check 2 readings separated by at least one minute Keep a log of your BP readings For a visual, please reference this diagram: http://ccnc.care/bpdiagram  Provider Name: [facility]     Phone: [phone]  Zone 1: ALL CLEAR  Continue to monitor your symptoms:  BP reading is less than 140 (top number) or less than 90 (bottom number)  No right upper stomach pain No headaches or seeing spots No feeling nauseated or throwing up No swelling in face and hands  Zone 2: CAUTION Call your doctor's office for any of the following:  BP reading is greater than 140 (top number) or greater than 90 (bottom number)  Stomach pain under your ribs in the middle or right side Headaches or seeing spots Feeling nauseated or throwing up Swelling in face and hands  Zone 3: EMERGENCY  Seek immediate medical care if you have any of the following:  BP reading is greater than160 (top number) or greater than 110 (bottom number) Severe headaches not improving with Tylenol Serious difficulty catching your breath Any worsening symptoms from Zone 2    First Trimester of Pregnancy The first trimester of pregnancy is from  week 1 until the end of week 12 (months 1 through 3). A week after a sperm fertilizes an egg, the egg will implant on the wall of the uterus. This embryo will begin to develop into a baby. Genes from you and your partner are forming the baby. The female genes determine whether the baby is a boy or a girl. At 6-8 weeks, the eyes and face are formed, and the heartbeat can be seen on ultrasound. At the end of 12 weeks, all the baby's organs are formed.  Now that you are pregnant, you will want to do everything you can to have a healthy baby. Two of the most important things are to get good prenatal care and to follow your health care provider's instructions. Prenatal care is all the medical care you receive before the baby's birth. This care will help prevent, find, and treat any problems during the pregnancy and childbirth. BODY CHANGES Your body goes through many changes during pregnancy. The changes vary from woman to woman.  You may gain or lose a couple of pounds at first. You may feel sick to your stomach (nauseous) and throw up (vomit). If the vomiting is uncontrollable, call your health care provider. You may tire easily. You may develop headaches that can be relieved by medicines approved by your health care provider. You may urinate more often. Painful urination may mean you have a bladder infection. You may develop heartburn as a result of your pregnancy. You may develop constipation because certain hormones are causing the muscles that push waste through your intestines to slow down. You may develop hemorrhoids or swollen, bulging veins (varicose veins). Your breasts may begin to grow larger and become tender. Your nipples may stick out more, and the tissue that surrounds them (areola) may become darker. Your gums may bleed and may be sensitive to brushing and flossing. Dark spots or blotches (chloasma, mask of pregnancy) may develop on your face. This will likely fade after the baby is  born. Your menstrual periods will stop. You may have a loss of appetite. You may develop cravings for certain kinds of food. You may have changes in your emotions from day to day, such as being excited to be pregnant or being concerned that something may go wrong with the pregnancy and baby. You may have more vivid and strange dreams. You may have changes in your hair. These can include thickening of your hair, rapid growth, and changes in texture. Some women also have hair loss during or after pregnancy, or hair that feels dry or thin. Your hair will most likely return to normal after your baby is born. WHAT TO EXPECT AT YOUR PRENATAL VISITS During a routine prenatal visit: You will be weighed to make sure you and the baby are growing normally. Your blood pressure will be taken. Your abdomen will be measured to track your baby's growth. The fetal  heartbeat will be listened to starting around week 10 or 12 of your pregnancy. Test results from any previous visits will be discussed. Your health care provider may ask you: How you are feeling. If you are feeling the baby move. If you have had any abnormal symptoms, such as leaking fluid, bleeding, severe headaches, or abdominal cramping. If you have any questions. Other tests that may be performed during your first trimester include: Blood tests to find your blood type and to check for the presence of any previous infections. They will also be used to check for low iron levels (anemia) and Rh antibodies. Later in the pregnancy, blood tests for diabetes will be done along with other tests if problems develop. Urine tests to check for infections, diabetes, or protein in the urine. An ultrasound to confirm the proper growth and development of the baby. An amniocentesis to check for possible genetic problems. Fetal screens for spina bifida and Down syndrome. You may need other tests to make sure you and the baby are doing well. HOME CARE  INSTRUCTIONS  Medicines Follow your health care provider's instructions regarding medicine use. Specific medicines may be either safe or unsafe to take during pregnancy. Take your prenatal vitamins as directed. If you develop constipation, try taking a stool softener if your health care provider approves. Diet Eat regular, well-balanced meals. Choose a variety of foods, such as meat or vegetable-based protein, fish, milk and low-fat dairy products, vegetables, fruits, and whole grain breads and cereals. Your health care provider will help you determine the amount of weight gain that is right for you. Avoid raw meat and uncooked cheese. These carry germs that can cause birth defects in the baby. Eating four or five small meals rather than three large meals a day may help relieve nausea and vomiting. If you start to feel nauseous, eating a few soda crackers can be helpful. Drinking liquids between meals instead of during meals also seems to help nausea and vomiting. If you develop constipation, eat more high-fiber foods, such as fresh vegetables or fruit and whole grains. Drink enough fluids to keep your urine clear or pale yellow. Activity and Exercise Exercise only as directed by your health care provider. Exercising will help you: Control your weight. Stay in shape. Be prepared for labor and delivery. Experiencing pain or cramping in the lower abdomen or low back is a good sign that you should stop exercising. Check with your health care provider before continuing normal exercises. Try to avoid standing for long periods of time. Move your legs often if you must stand in one place for a long time. Avoid heavy lifting. Wear low-heeled shoes, and practice good posture. You may continue to have sex unless your health care provider directs you otherwise. Relief of Pain or Discomfort Wear a good support bra for breast tenderness.   Take warm sitz baths to soothe any pain or discomfort caused by  hemorrhoids. Use hemorrhoid cream if your health care provider approves.   Rest with your legs elevated if you have leg cramps or low back pain. If you develop varicose veins in your legs, wear support hose. Elevate your feet for 15 minutes, 3-4 times a day. Limit salt in your diet. Prenatal Care Schedule your prenatal visits by the twelfth week of pregnancy. They are usually scheduled monthly at first, then more often in the last 2 months before delivery. Write down your questions. Take them to your prenatal visits. Keep all your prenatal visits as directed  by your health care provider. Safety Wear your seat belt at all times when driving. Make a list of emergency phone numbers, including numbers for family, friends, the hospital, and police and fire departments. General Tips Ask your health care provider for a referral to a local prenatal education class. Begin classes no later than at the beginning of month 6 of your pregnancy. Ask for help if you have counseling or nutritional needs during pregnancy. Your health care provider can offer advice or refer you to specialists for help with various needs. Do not use hot tubs, steam rooms, or saunas. Do not douche or use tampons or scented sanitary pads. Do not cross your legs for long periods of time. Avoid cat litter boxes and soil used by cats. These carry germs that can cause birth defects in the baby and possibly loss of the fetus by miscarriage or stillbirth. Avoid all smoking, herbs, alcohol, and medicines not prescribed by your health care provider. Chemicals in these affect the formation and growth of the baby. Schedule a dentist appointment. At home, brush your teeth with a soft toothbrush and be gentle when you floss. SEEK MEDICAL CARE IF:  You have dizziness. You have mild pelvic cramps, pelvic pressure, or nagging pain in the abdominal area. You have persistent nausea, vomiting, or diarrhea. You have a bad smelling vaginal  discharge. You have pain with urination. You notice increased swelling in your face, hands, legs, or ankles. SEEK IMMEDIATE MEDICAL CARE IF:  You have a fever. You are leaking fluid from your vagina. You have spotting or bleeding from your vagina. You have severe abdominal cramping or pain. You have rapid weight gain or loss. You vomit blood or material that looks like coffee grounds. You are exposed to [place] measles and have never had them. You are exposed to fifth disease or chickenpox. You develop a severe headache. You have shortness of breath. You have any kind of trauma, such as from a fall or a car accident. Document Released: 08/23/2001 Document Revised: 01/13/2014 Document Reviewed: 07/09/2013 [facility] Patient Information 2015 [place], [place]. This information is not intended to replace advice given to you by your health care provider. Make sure you discuss any questions you have with your health care provider.  Coronavirus (COVID-19) Are you at risk?  Are you at risk for the Coronavirus (COVID-19)?  To be considered HIGH RISK for Coronavirus (COVID-19), you have to meet the following criteria:  Traveled to [place], [place], [place], [place] or [place];  and have fever, cough, and shortness of breath within the last 2 weeks of travel OR Been in close contact with a person diagnosed with COVID-19 within the last 2 weeks and have fever, cough, and shortness of breath IF YOU DO NOT MEET THESE CRITERIA, YOU ARE CONSIDERED LOW RISK FOR COVID-19.  What to do if you are HIGH RISK for COVID-19?  If you are having a medical emergency, call 911. Seek medical care right away. Before you go to a doctor's office, urgent care or emergency department, call ahead and tell them about your recent travel, contact with someone diagnosed with COVID-19, and your symptoms. You should receive instructions from your physician's office regarding next steps of care.  When you arrive at healthcare provider,  tell the healthcare staff immediately you have returned from visiting [place], [place], [place], [place] or [place]; in the last two weeks or you have been in close contact with a person diagnosed with COVID-19 in the last 2 weeks.  Tell the health care staff about your symptoms: fever, cough and shortness of breath. After you have been seen by a medical provider, you will be either: Tested for (COVID-19) and discharged home on quarantine except to seek medical care if symptoms worsen, and asked to  Stay home and avoid contact with others until you get your results (4-5 days)  Avoid travel on public transportation if possible (such as bus, train, or airplane) or Sent to the Emergency Department by EMS for evaluation, COVID-19 testing, and possible admission depending on your condition and test results.  What to do if you are LOW RISK for COVID-19?  Reduce your risk of any infection by using the same precautions used for avoiding the common cold or flu:  Wash your hands often with soap and warm water for at least 20 seconds.  If soap and water are not readily available, use an alcohol-based hand sanitizer with at least 60% alcohol.  If coughing or sneezing, cover your mouth and nose by coughing or sneezing into the elbow areas of your shirt or coat, into a tissue or into your sleeve (not your hands). Avoid shaking hands with others and consider head nods or verbal greetings only. Avoid touching your eyes, nose, or mouth with unwashed hands.  Avoid close contact with people who are sick. Avoid places or events with large numbers of people in one location, like concerts or sporting events. Carefully consider travel plans you have or are making. If you are planning any travel outside or inside the [place], visit the CDC's Travelers' Health webpage for the latest health notices. If you have some symptoms but not all symptoms, continue to monitor at home and seek medical attention if your symptoms worsen. If  you are having a medical emergency, call 911.   ADDITIONAL HEALTHCARE OPTIONS FOR [place] / e-Visit: [email or web address]         [facility]: [facility]: [id number]                   [facility]: [phone]     Safe Medications in Pregnancy   Acne: Benzoyl Peroxide Salicylic Acid  Backache/Headache: Tylenol: 2 regular strength every 4 hours OR              2 Extra strength every 6 hours  Colds/Coughs/Allergies: Benadryl (alcohol free) 25 mg every 6 hours as needed Breath right strips Claritin Cepacol throat lozenges Chloraseptic throat spray Cold-Eeze- up to three times per day Cough drops, alcohol free Flonase (by prescription only) Guaifenesin Mucinex Robitussin DM (plain only, alcohol free) Saline nasal spray/drops Sudafed (pseudoephedrine) & Actifed ** use only after [redacted] weeks gestation and if you do not have high blood pressure Tylenol Vicks Vaporub Zinc lozenges Zyrtec   Constipation: Colace Ducolax suppositories Fleet enema Glycerin suppositories Metamucil Milk of magnesia Miralax Senokot Smooth move tea  Diarrhea: Kaopectate Imodium A-D  *NO pepto Bismol  Hemorrhoids: Anusol Anusol HC Preparation H Tucks  Indigestion: Tums Maalox Mylanta Zantac  Pepcid  Insomnia: Benadryl (alcohol free) 36m every 6 hours as needed Tylenol PM Unisom, no Gelcaps  Leg Cramps: Tums MagGel  Nausea/Vomiting:  Bonine Dramamine Emetrol Ginger extract Sea bands Meclizine  Nausea medication to take during pregnancy:  Unisom (doxylamine succinate 25 mg tablets) Take one tablet daily at bedtime. If symptoms are not adequately controlled, the dose can be increased to a maximum recommended dose of two tablets daily (1/2 tablet in  the morning, 1/2 tablet mid-afternoon and one at bedtime). Vitamin B6 160m tablets. Take one  tablet twice a day (up to 200 mg per day).  Skin Rashes: Aveeno products Benadryl cream or 250mevery 6 hours as needed Calamine Lotion 1% cortisone cream  Yeast infection: Gyne-lotrimin 7 Monistat 7   **If taking multiple medications, please check labels to avoid duplicating the same active ingredients **take medication as directed on the label ** Do not exceed 4000 mg of tylenol in 24 hours **Do not take medications that contain aspirin or ibuprofen

## 2021-03-25 NOTE — Progress Notes (Addendum)
INITIAL OBSTETRICAL VISIT Patient name: TRICHA RUGGIRELLO MRN [id number]  Date of birth: [birth year] Chief Complaint:   Initial Prenatal Visit  History of Present Illness:   ALIANNAH HOLSTROM is a 25 y.o. G50P1001 Caucasian female at [redacted]w[redacted]d by LMP c/w u/s at 12 weeks with an Estimated Date of Delivery: 10/06/21 being seen today for her initial obstetrical visit.   Her obstetrical history is significant for term SVD w/o problems.    .   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  Had planned to go to detox a month ago, but life happened, etc, and it didn't work out.  Plans to go in a few weeks or so. Discussed replacement therapy, encouraged it to prevent accidental overdose, Had not wanted to last month, but is open to it now .  Depression screen PHQ 2/9 02/18/2021  Decreased Interest 2  Down, Depressed, Hopeless 2  PHQ - 2 Score 4  Altered sleeping 0  Tired, decreased energy 3  Change in appetite 2  Feeling bad or failure about yourself  2  Trouble concentrating 1  Moving slowly or fidgety/restless 0  Suicidal thoughts 0  PHQ-9 Score 12    Patient's last menstrual period was 12/30/2020 (approximate). Last pap 8 months ago at [facility]. Results were: normal per pt.  Will request records.  Review of Systems:   Pertinent items are noted in HPI Denies cramping/contractions, leakage of fluid, vaginal bleeding, abnormal vaginal discharge w/ itching/odor/irritation, headaches, visual changes, shortness of breath, chest pain, abdominal pain, severe nausea/vomiting, or problems with urination or bowel movements unless otherwise stated above.  Pertinent History Reviewed:  Reviewed past medical,surgical, social, obstetrical and family history.  Reviewed problem list, medications and allergies. OB History  Gravida Para Term Preterm AB Living  2 1 1  0 0 1  SAB IAB Ectopic Multiple Live Births  0 0 0   1    # Outcome Date GA Lbr Len/2nd Weight Sex Delivery Anes PTL Lv  2 Current           1 Term 04/15/14  [redacted]w[redacted]d 20:02 / 01:22 8 lb 3.8 oz (3.737 kg) F Vag-Spont EPI  LIV     Birth Comments: THC and benzo use during pregnancy with + UDS and + Meconium for cannabis in nursery   Physical Assessment:  There were no vitals filed for this visit.There is no height or weight on file to calculate BMI.       Physical Examination:  General appearance - well appearing, and in no distress  Mental status - alert, oriented to person, place, and time  Psych:  She has a normal mood and affect  Skin - warm and dry, normal color, no suspicious lesions noted  Chest - effort normal  Heart - normal rate and regular rhythm  Abdomen - soft, nontender  Extremities:  No swelling or varicosities noted   TODAY'S NT [redacted]w[redacted]d 12+1 wks,DI/DI twins,normal ovaries BABY A: CRL 58.94 mm,NB present,NT 1.2 mm,FHR 171 bpm,posterior placenta BABY B: CRL 3.2 mm=5+6 wks,GS 44.4 mm=9+6 wks,no FHT visualized  No results found for this or any previous visit (from the past 24 hour(s)).  Assessment & Plan:  1) High-Risk Pregnancy G2P1001 at [redacted]w[redacted]d with an Estimated Date of Delivery: 10/06/21   2) Initial OB visit  3) Heroin use: appt 7/27 ay 11:15 w/[facility] for suboxone; referral to [facility] sent  4)  Vanishing twin:  6 weeks size  Meds: No orders of the defined types were placed in  this encounter.   Initial labs obtained Continue prenatal vitamins Reviewed n/v relief measures and warning s/s to report Reviewed recommended weight gain based on pre-gravid BMI Encouraged well-balanced diet Genetic & carrier screening discussed: requests Panorama, NT/IT, and Horizon , declines AFP Ultrasound discussed; fetal survey: requested [facility]NC completed> form faxed if has or is planning to apply for medicaid The nature of [facility] [employer] with multiple MDs and other Advanced Practice Providers was explained to patient; also emphasized that fellows, residents, and students are part of our team. Given a  home bp cuff.. Check bp  weekly, let us know if >140/90.        Scarlette Calico Cresenzo-Dishmon 2:15 PM

## 2021-03-25 NOTE — Progress Notes (Signed)
[place] 12+1 wks,DI/DI twins,normal ovaries BABY A: CRL 58.94 mm,NB present,NT 1.2 mm,FHR 171 bpm,posterior placenta  BABY B: CRL 3.2 mm=5+6 wks,GS 44.4 mm=9+6 wks,no FHT visualized

## 2021-03-26 LAB — PMP SCREEN PROFILE (10S), URINE
Amphetamine Scrn, Ur: POSITIVE ng/mL — AB
BARBITURATE SCREEN URINE: NEGATIVE ng/mL
BENZODIAZEPINE SCREEN, URINE: NEGATIVE ng/mL
CANNABINOIDS UR QL SCN: NEGATIVE ng/mL
Cocaine (Metab) Scrn, Ur: NEGATIVE ng/mL
Creatinine(Crt), U: 288.6 mg/dL (ref 20.0–300.0)
Methadone Screen, Urine: NEGATIVE ng/mL
OXYCODONE+OXYMORPHONE UR QL SCN: NEGATIVE ng/mL
Opiate Scrn, Ur: POSITIVE ng/mL — AB
Ph of Urine: 5.8 (ref 4.5–8.9)
Phencyclidine Qn, Ur: NEGATIVE ng/mL
Propoxyphene Scrn, Ur: NEGATIVE ng/mL

## 2021-03-27 LAB — URINE CULTURE

## 2021-03-28 LAB — GC/CHLAMYDIA PROBE AMP
Chlamydia trachomatis, NAA: NEGATIVE
Neisseria Gonorrhoeae by PCR: NEGATIVE

## 2021-03-31 LAB — INTEGRATED 1
Crown Rump Length: 58.9 mm
Gest. Age on Collection Date: 12.4 weeks
Maternal Age at EDD: 26.5 yr
Nuchal Translucency (NT): 1.2 mm
Number of Fetuses: 1
PAPP-A Value: 644.9 ng/mL
Weight: 129 [lb_av]

## 2021-03-31 LAB — HCV RT-PCR, QUANT (NON-GRAPH)
HCV log10: 1.699 log10 IU/mL
Hepatitis C Quantitation: 50 IU/mL

## 2021-03-31 LAB — CBC/D/PLT+RPR+RH+ABO+RUBIGG...
Antibody Screen: NEGATIVE
Basophils Absolute: 0 10*3/uL (ref 0.0–0.2)
Basos: 0 %
EOS (ABSOLUTE): 0 10*3/uL (ref 0.0–0.4)
Eos: 0 %
HCV Ab: >11 s/co ratio — ABNORMAL HIGH (ref 0.0–0.9)
HIV Screen 4th Generation wRfx: NONREACTIVE
Hematocrit: 40.8 % (ref 34.0–46.6)
Hemoglobin: 14 g/dL (ref 11.1–15.9)
Hepatitis B Surface Ag: NEGATIVE
Immature Grans (Abs): 0 10*3/uL (ref 0.0–0.1)
Immature Granulocytes: 0 %
Lymphocytes Absolute: 2 10*3/uL (ref 0.7–3.1)
Lymphs: 25 %
MCH: 30.8 pg (ref 26.6–33.0)
MCHC: 34.3 g/dL (ref 31.5–35.7)
MCV: 90 fL (ref 79–97)
Monocytes Absolute: 0.7 10*3/uL (ref 0.1–0.9)
Monocytes: 8 %
Neutrophils Absolute: 5.2 10*3/uL (ref 1.4–7.0)
Neutrophils: 67 %
Platelets: 240 10*3/uL (ref 150–450)
RBC: 4.55 x10E6/uL (ref 3.77–5.28)
RDW: 13 % (ref 11.7–15.4)
RPR Ser Ql: NONREACTIVE
Rh Factor: POSITIVE
Rubella Antibodies, IGG: 5.67 index (ref >0.99)
WBC: 7.9 10*3/uL (ref 3.4–10.8)

## 2021-04-01 ENCOUNTER — Other Ambulatory Visit: Payer: Self-pay | Admitting: Advanced Practice Midwife

## 2021-04-07 ENCOUNTER — Encounter: Payer: Medicaid Other | Admitting: Family Medicine

## 2021-04-14 ENCOUNTER — Encounter: Payer: Medicaid Other | Admitting: Family Medicine

## 2021-04-22 ENCOUNTER — Encounter: Payer: Self-pay | Admitting: *Deleted

## 2021-04-23 ENCOUNTER — Encounter: Payer: Medicaid Other | Admitting: Family Medicine

## 2021-04-23 DIAGNOSIS — O0990 Supervision of high risk pregnancy, unspecified, unspecified trimester: Secondary | ICD-10-CM | POA: Insufficient documentation

## 2021-06-19 ENCOUNTER — Ambulatory Visit (HOSPITAL_COMMUNITY)
Admission: RE | Admit: 2021-06-19 | Discharge: 2021-06-19 | Disposition: A | Discharge status: Home / Self Care | Payer: Medicaid Other | Attending: Emergency Medicine | Admitting: Emergency Medicine

## 2021-06-19 DIAGNOSIS — F1120 Opioid dependence, uncomplicated: Secondary | ICD-10-CM | POA: Insufficient documentation

## 2021-06-19 NOTE — BH Assessment (Signed)
Patient is a 25 year old that presents this date requesting resources for ongoing SA issues. Patient reports that she is 5 months pregnant and has been using heroin daily for the last month stating she uses up to one tenth of a gram daily with last use prior to arrival. Patient denies the use of any other substances. Patient denies any prior mental health diagnosis. Patient denies any S/I, H/I or AVH. Patient was provided with multiple resources on discharge to assist with ongoing needs.     [name] is an 25 y.o. female who presented voluntarily with her mother for assessment and requesting medical assisted detox from heroin, she reports being 5 months pregnant. Last use was "this morning" where she reports doing "a line". She denies any suicidal or homicidal thoughts, auditory or visual hallucinations.  Unable to report exact gestation; states she stopped seeing doctor "about a month ago because I didn't want to test dirty". Endorses active fetal movements. Denies any chest pain, diaphoresis, chills, headache, nausea or vomiting, and is currently alert and oriented x3. Patient is requesting inpatient medical assisted detox from heroin "to get clean" further stating, "I'm too scared to do it from home because I know you have to be careful with this".

## 2021-06-19 NOTE — H&P (Signed)
Behavioral Health Medical Screening Exam  [name] is an 25 y.o. female who presented voluntarily with her mother for assessment and requesting medical assisted detox from heroin, she reports being 5 months pregnant. Last use was "this morning" where she reports doing "a line". She denies any suicidal or homicidal thoughts, auditory or visual hallucinations.  Unable to report exact gestation; states she stopped seeing doctor "about a month ago because I didn't want to test dirty". Endorses active fetal movements. Denies any chest pain, diaphoresis, chills, headache, nausea or vomiting, and is currently alert and oriented x3. Patient is requesting inpatient medical assisted detox from heroin "to get clean" further stating, "I'm too scared to do it from home because I know you have to be careful with this".   Total Time spent with patient: 20 minutes  Psychiatric Specialty Exam: Physical Exam Vitals and nursing note reviewed.  Constitutional:      Appearance: Normal appearance.  HENT:     Head: Normocephalic.     Nose: Nose normal.     Mouth/Throat:     Mouth: Mucous membranes are moist.  Eyes:     Pupils: Pupils are equal, round, and reactive to light.  Cardiovascular:     Rate and Rhythm: Normal rate.     Pulses: Normal pulses.  Pulmonary:     Effort: Pulmonary effort is normal.  Abdominal:     Comments: Pt reports being 5 months pregnant; endorses active fetal movements.   Musculoskeletal:        General: Normal range of motion.     Cervical back: Normal range of motion.  Skin:    General: Skin is warm and dry.  Neurological:     General: No focal deficit present.     Mental Status: She is alert and oriented to person, place, and time. Mental status is at baseline.  Psychiatric:        Attention and Perception: Attention and perception normal.        Mood and Affect: Mood and affect normal.        Speech: Speech normal.        Behavior: Behavior normal. Behavior is  cooperative.        Thought Content: Thought content normal. Thought content is not paranoid or delusional. Thought content does not include homicidal or suicidal ideation. Thought content does not include homicidal or suicidal plan.        Cognition and Memory: Cognition and memory normal.        Judgment: Judgment normal.   Review of Systems  Constitutional:  Negative for chills and diaphoresis.  Genitourinary:  Negative for pelvic pain.  Psychiatric/Behavioral:  Negative for agitation, behavioral problems, confusion, self-injury and suicidal ideas.   All other systems reviewed and are negative. Blood pressure 111/74, pulse 100, temperature 98.2 F (36.8 C), temperature source Oral, resp. rate 18, last menstrual period 12/30/2020, SpO2 100 %.There is no height or weight on file to calculate BMI. General Appearance: Casual Eye Contact:  Fair Speech:  Normal Rate Volume:  Normal Mood:  Euthymic Affect:  Congruent Thought Process:  Goal Directed Orientation:  Full (Time, Place, and Person) Thought Content:  WDL and Logical Suicidal Thoughts:  No Homicidal Thoughts:  No Memory:  Immediate;   Fair Recent;   Fair Remote;   Fair Judgement:  Fair Insight:  Fair Psychomotor Activity:  Normal Concentration: Concentration: Fair and Attention Span: Fair Recall:  [employer] of Knowledge:Fair Language: Fair Akathisia:  NA Handed:  AIMS (if indicated):    Assets:  Communication Skills Desire for Improvement Housing Physical Health Resilience Social Support Transportation Vocational/Educational Sleep:     Musculoskeletal: Strength & Muscle Tone: within normal limits Gait & Station: normal Patient leans: N/A  Blood pressure 111/74, pulse 100, temperature 98.2 F (36.8 C), temperature source Oral, resp. rate 18, last menstrual period 12/30/2020, SpO2 100 %.  Recommendations: Based on my evaluation the patient does not appear to have an emergency medical condition. Patient  provided with resources of detox facilities that treat pregnant women and opioid addiction in the area including [facility].   Loletta Parish, NP 06/19/2021, 5:25 PM

## 2021-06-20 DIAGNOSIS — F1120 Opioid dependence, uncomplicated: Secondary | ICD-10-CM | POA: Diagnosis present

## 2021-08-21 ENCOUNTER — Encounter (HOSPITAL_COMMUNITY): Payer: Self-pay | Admitting: Obstetrics and Gynecology

## 2021-08-21 ENCOUNTER — Other Ambulatory Visit: Payer: Self-pay

## 2021-08-21 ENCOUNTER — Inpatient Hospital Stay (HOSPITAL_COMMUNITY)
Admission: AD | Admit: 2021-08-21 | Discharge: 2021-08-26 | DRG: 786 | Disposition: A | Discharge status: Home / Self Care | Payer: Medicaid Other | Attending: Obstetrics & Gynecology | Admitting: Obstetrics & Gynecology

## 2021-08-21 ENCOUNTER — Inpatient Hospital Stay (HOSPITAL_BASED_OUTPATIENT_CLINIC_OR_DEPARTMENT_OTHER): Payer: Medicaid Other

## 2021-08-21 DIAGNOSIS — Z20822 Contact with and (suspected) exposure to covid-19: Secondary | ICD-10-CM | POA: Diagnosis present

## 2021-08-21 DIAGNOSIS — O4593 Premature separation of placenta, unspecified, third trimester: Secondary | ICD-10-CM | POA: Diagnosis present

## 2021-08-21 DIAGNOSIS — O98413 Viral hepatitis complicating pregnancy, third trimester: Secondary | ICD-10-CM | POA: Diagnosis not present

## 2021-08-21 DIAGNOSIS — Z30430 Encounter for insertion of intrauterine contraceptive device: Secondary | ICD-10-CM | POA: Diagnosis not present

## 2021-08-21 DIAGNOSIS — O4693 Antepartum hemorrhage, unspecified, third trimester: Secondary | ICD-10-CM

## 2021-08-21 DIAGNOSIS — O99324 Drug use complicating childbirth: Secondary | ICD-10-CM | POA: Diagnosis present

## 2021-08-21 DIAGNOSIS — Z3689 Encounter for other specified antenatal screening: Secondary | ICD-10-CM

## 2021-08-21 DIAGNOSIS — O0990 Supervision of high risk pregnancy, unspecified, unspecified trimester: Secondary | ICD-10-CM

## 2021-08-21 DIAGNOSIS — O321XX0 Maternal care for breech presentation, not applicable or unspecified: Secondary | ICD-10-CM | POA: Diagnosis present

## 2021-08-21 DIAGNOSIS — F17210 Nicotine dependence, cigarettes, uncomplicated: Secondary | ICD-10-CM | POA: Diagnosis present

## 2021-08-21 DIAGNOSIS — O99334 Smoking (tobacco) complicating childbirth: Secondary | ICD-10-CM | POA: Diagnosis present

## 2021-08-21 DIAGNOSIS — O321XX1 Maternal care for breech presentation, fetus 1: Secondary | ICD-10-CM | POA: Diagnosis not present

## 2021-08-21 DIAGNOSIS — O0933 Supervision of pregnancy with insufficient antenatal care, third trimester: Secondary | ICD-10-CM

## 2021-08-21 DIAGNOSIS — Z3A33 33 weeks gestation of pregnancy: Secondary | ICD-10-CM

## 2021-08-21 DIAGNOSIS — T6592XA Toxic effect of unspecified substance, intentional self-harm, initial encounter: Secondary | ICD-10-CM | POA: Diagnosis present

## 2021-08-21 DIAGNOSIS — O365990 Maternal care for other known or suspected poor fetal growth, unspecified trimester, not applicable or unspecified: Secondary | ICD-10-CM

## 2021-08-21 DIAGNOSIS — Z98891 History of uterine scar from previous surgery: Secondary | ICD-10-CM

## 2021-08-21 DIAGNOSIS — O99323 Drug use complicating pregnancy, third trimester: Secondary | ICD-10-CM | POA: Diagnosis not present

## 2021-08-21 DIAGNOSIS — F332 Major depressive disorder, recurrent severe without psychotic features: Secondary | ICD-10-CM | POA: Diagnosis present

## 2021-08-21 DIAGNOSIS — O9842 Viral hepatitis complicating childbirth: Secondary | ICD-10-CM | POA: Diagnosis present

## 2021-08-21 DIAGNOSIS — O1415 Severe pre-eclampsia, complicating the puerperium: Secondary | ICD-10-CM | POA: Diagnosis present

## 2021-08-21 DIAGNOSIS — O3110X0 Continuing pregnancy after spontaneous abortion of one fetus or more, unspecified trimester, not applicable or unspecified: Secondary | ICD-10-CM | POA: Diagnosis present

## 2021-08-21 DIAGNOSIS — O9081 Anemia of the puerperium: Secondary | ICD-10-CM | POA: Diagnosis not present

## 2021-08-21 DIAGNOSIS — O1414 Severe pre-eclampsia complicating childbirth: Secondary | ICD-10-CM | POA: Diagnosis present

## 2021-08-21 DIAGNOSIS — O4690 Antepartum hemorrhage, unspecified, unspecified trimester: Secondary | ICD-10-CM

## 2021-08-21 DIAGNOSIS — F1910 Other psychoactive substance abuse, uncomplicated: Secondary | ICD-10-CM | POA: Diagnosis not present

## 2021-08-21 DIAGNOSIS — F1110 Opioid abuse, uncomplicated: Secondary | ICD-10-CM | POA: Diagnosis not present

## 2021-08-21 DIAGNOSIS — O4433 Partial placenta previa with hemorrhage, third trimester: Secondary | ICD-10-CM | POA: Diagnosis present

## 2021-08-21 DIAGNOSIS — B1920 Unspecified viral hepatitis C without hepatic coma: Secondary | ICD-10-CM | POA: Diagnosis present

## 2021-08-21 DIAGNOSIS — O4590 Premature separation of placenta, unspecified, unspecified trimester: Secondary | ICD-10-CM | POA: Diagnosis present

## 2021-08-21 DIAGNOSIS — F1990 Other psychoactive substance use, unspecified, uncomplicated: Secondary | ICD-10-CM | POA: Diagnosis present

## 2021-08-21 DIAGNOSIS — O30043 Twin pregnancy, dichorionic/diamniotic, third trimester: Secondary | ICD-10-CM | POA: Diagnosis not present

## 2021-08-21 DIAGNOSIS — O99344 Other mental disorders complicating childbirth: Secondary | ICD-10-CM | POA: Diagnosis present

## 2021-08-21 DIAGNOSIS — F1120 Opioid dependence, uncomplicated: Secondary | ICD-10-CM | POA: Diagnosis present

## 2021-08-21 DIAGNOSIS — O365930 Maternal care for other known or suspected poor fetal growth, third trimester, not applicable or unspecified: Secondary | ICD-10-CM | POA: Diagnosis present

## 2021-08-21 DIAGNOSIS — Z30014 Encounter for initial prescription of intrauterine contraceptive device: Secondary | ICD-10-CM | POA: Diagnosis not present

## 2021-08-21 DIAGNOSIS — O1493 Unspecified pre-eclampsia, third trimester: Secondary | ICD-10-CM

## 2021-08-21 LAB — CBC
HCT: 34.8 % — ABNORMAL LOW (ref 36.0–46.0)
Hemoglobin: 11.7 g/dL — ABNORMAL LOW (ref 12.0–15.0)
MCH: 31.4 pg (ref 26.0–34.0)
MCHC: 33.6 g/dL (ref 30.0–36.0)
MCV: 93.3 fL (ref 80.0–100.0)
Platelets: 236 10*3/uL (ref 150–400)
RBC: 3.73 MIL/uL — ABNORMAL LOW (ref 3.87–5.11)
RDW: 13.7 % (ref 11.5–15.5)
WBC: 12.8 10*3/uL — ABNORMAL HIGH (ref 4.0–10.5)
nRBC: 0 % (ref 0.0–0.2)

## 2021-08-21 LAB — URINALYSIS, ROUTINE W REFLEX MICROSCOPIC
Bilirubin Urine: NEGATIVE
Glucose, UA: NEGATIVE mg/dL
Ketones, ur: 15 mg/dL — AB
Leukocytes,Ua: NEGATIVE
Nitrite: POSITIVE — AB
Protein, ur: 100 mg/dL — AB
Specific Gravity, Urine: 1.025 (ref 1.005–1.030)
pH: 6.5 (ref 5.0–8.0)

## 2021-08-21 LAB — TYPE AND SCREEN
ABO/RH(D): A POS
Antibody Screen: NEGATIVE

## 2021-08-21 LAB — COMPREHENSIVE METABOLIC PANEL
ALT: 23 U/L (ref 0–44)
AST: 24 U/L (ref 15–41)
Albumin: 2.3 g/dL — ABNORMAL LOW (ref 3.5–5.0)
Alkaline Phosphatase: 112 U/L (ref 38–126)
Anion gap: 7 (ref 5–15)
BUN: 9 mg/dL (ref 6–20)
CO2: 24 mmol/L (ref 22–32)
Calcium: 8.4 mg/dL — ABNORMAL LOW (ref 8.9–10.3)
Chloride: 104 mmol/L (ref 98–111)
Creatinine, Ser: 0.6 mg/dL (ref 0.44–1.00)
GFR, Estimated: >60 mL/min (ref >60)
Glucose, Bld: 86 mg/dL (ref 70–99)
Potassium: 3.9 mmol/L (ref 3.5–5.1)
Sodium: 135 mmol/L (ref 135–145)
Total Bilirubin: 0.3 mg/dL (ref 0.3–1.2)
Total Protein: 6.3 g/dL — ABNORMAL LOW (ref 6.5–8.1)

## 2021-08-21 LAB — HEPATITIS B SURFACE ANTIGEN: Hepatitis B Surface Ag: NONREACTIVE

## 2021-08-21 LAB — URINALYSIS, MICROSCOPIC (REFLEX)
Bacteria, UA: NONE SEEN
RBC / HPF: >50 RBC/hpf (ref 0–5)

## 2021-08-21 LAB — PROTEIN / CREATININE RATIO, URINE
Creatinine, Urine: 284.22 mg/dL
Protein Creatinine Ratio: 0.31 mg/mg{Cre} — ABNORMAL HIGH (ref 0.00–0.15)
Total Protein, Urine: 88 mg/dL

## 2021-08-21 LAB — RESP PANEL BY RT-PCR (FLU A&B, COVID) ARPGX2
Influenza A by PCR: NEGATIVE
Influenza B by PCR: NEGATIVE
SARS Coronavirus 2 by RT PCR: NEGATIVE

## 2021-08-21 LAB — WET PREP, GENITAL
Clue Cells Wet Prep HPF POC: NONE SEEN
Sperm: NONE SEEN
Trich, Wet Prep: NONE SEEN
WBC, Wet Prep HPF POC: <10 (ref <10)
Yeast Wet Prep HPF POC: NONE SEEN

## 2021-08-21 LAB — HIV ANTIBODY (ROUTINE TESTING W REFLEX): HIV Screen 4th Generation wRfx: NONREACTIVE

## 2021-08-21 MED ORDER — PRENATAL MULTIVITAMIN CH
1.0000 | ORAL_TABLET | Freq: Every day | ORAL | Status: DC
  Administered 2021-08-22 – 2021-08-23 (×2): 1 via ORAL
  Filled 2021-08-21 (×2): qty 1

## 2021-08-21 MED ORDER — CALCIUM CARBONATE ANTACID 500 MG PO CHEW
2.0000 | CHEWABLE_TABLET | ORAL | Status: DC | PRN
  Administered 2021-08-21: 400 mg via ORAL
  Filled 2021-08-21 (×2): qty 2

## 2021-08-21 MED ORDER — LACTATED RINGERS IV SOLN: INTRAVENOUS | Status: DC

## 2021-08-21 MED ORDER — ACETAMINOPHEN 325 MG PO TABS
650.0000 mg | ORAL_TABLET | ORAL | Status: DC | PRN
  Administered 2021-08-22 (×2): 650 mg via ORAL
  Filled 2021-08-21 (×2): qty 2

## 2021-08-21 MED ORDER — NICOTINE 7 MG/24HR TD PT24
7.0000 mg | MEDICATED_PATCH | Freq: Every day | TRANSDERMAL | Status: DC
  Filled 2021-08-21: qty 1

## 2021-08-21 MED ORDER — INFLUENZA VAC SPLIT QUAD 0.5 ML IM SUSY: 0.5000 mL | PREFILLED_SYRINGE | INTRAMUSCULAR | Status: DC

## 2021-08-21 MED ORDER — DOCUSATE SODIUM 100 MG PO CAPS
100.0000 mg | ORAL_CAPSULE | Freq: Every day | ORAL | Status: DC
  Administered 2021-08-22 – 2021-08-23 (×2): 100 mg via ORAL
  Filled 2021-08-21 (×2): qty 1

## 2021-08-21 MED ORDER — LACTATED RINGERS IV BOLUS
1000.0000 mL | Freq: Once | INTRAVENOUS | Status: AC
  Administered 2021-08-21: 1000 mL via INTRAVENOUS

## 2021-08-21 MED ORDER — BETAMETHASONE SOD PHOS & ACET 6 (3-3) MG/ML IJ SUSP
12.0000 mg | INTRAMUSCULAR | Status: AC
  Administered 2021-08-21 – 2021-08-22 (×2): 12 mg via INTRAMUSCULAR
  Filled 2021-08-21: qty 5

## 2021-08-21 MED ORDER — ZOLPIDEM TARTRATE 5 MG PO TABS
5.0000 mg | ORAL_TABLET | Freq: Every evening | ORAL | Status: DC | PRN
  Administered 2021-08-21 – 2021-08-22 (×2): 5 mg via ORAL
  Filled 2021-08-21 (×2): qty 1

## 2021-08-21 MED ORDER — NICOTINE 14 MG/24HR TD PT24
14.0000 mg | MEDICATED_PATCH | Freq: Every day | TRANSDERMAL | Status: DC
Start: 2021-08-21 — End: 2021-08-25
  Administered 2021-08-21 – 2021-08-24 (×5): 14 mg via TRANSDERMAL
  Filled 2021-08-21 (×5): qty 1

## 2021-08-21 NOTE — MAU Note (Signed)
Woke up about 9, was bleeding, has changed pad once. Had intercourse a few days ago.  Is feeling like mild period cramps. First time she has had bleeding.

## 2021-08-21 NOTE — H&P (Signed)
FACULTY PRACTICE ANTEPARTUM ADMISSION HISTORY AND PHYSICAL NOTE   History of Present Illness: [name] is a 25 y.o. G2P1001 at [redacted]w[redacted]d admitted for vaginal bleeding. Has not had prenatal care since her 12 week appointment with [facility]. Uses heroin - last use was yesterday. Denies any other drug use.  Vaginal bleeding started this morning around 9 am. Reports heavy dark red bleeding. Also reports some intermittent lower abdominal cramping. Denies LOF, dysuria, recent intercourse. Reports good fetal movement.  Denies history of hypertension. Denies headache, visual disturbance, or epigastric pain.   Patient reports the fetal movement as active. Patient reports uterine contraction  activity as none. Patient reports  vaginal bleeding as flow about like a period. Patient describes fluid per vagina as None. Fetal presentation is unsure. 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 coli sepsis (HCC) 02/21/2015   E. coli UTI 02/21/2015   Suicide attempt by substance overdose [facility])    Suicide attempt by drug ingestion (HCC) 12/30/2014   MDD (major depressive disorder), recurrent episode, severe (HCC) 12/29/2014   Vaginal delivery 04/16/2014   Illicit drug use 09/03/2013    Past Medical History:  Diagnosis Date   Medical history non-contributory     Past Surgical History:  Procedure Laterality Date   NO PAST SURGERIES      OB History  Gravida Para Term Preterm AB Living  2 1 1  0 0 1  SAB IAB Ectopic Multiple Live Births  0 0 0   1    # Outcome Date GA  Lbr Len/2nd Weight Sex Delivery Anes PTL Lv  2 Current           1 Term 04/15/14 [redacted]w[redacted]d 20:02 / 01:22 3737 g F Vag-Spont EPI  LIV     Birth Comments: THC and benzo use during pregnancy with + UDS and + Meconium for cannabis in nursery    Social History   Socioeconomic History   Marital status: Single    Spouse name: Not on file   Number of children: Not on file   Years of education: Not on file   Highest education level: Not on file  Occupational History   Not on file  Tobacco Use   Smoking status: Every Day    Packs/day: 0.50    Years: 5.00    Pack years: 2.50    Types: Cigarettes   Smokeless tobacco: Never  Vaping Use   Vaping Use: Never used  Substance and Sexual Activity   Alcohol use: No   Drug use: Yes    Types: Benzodiazepines, Hydrocodone, Heroin, Methamphetamines    Comment: Heroin 08/20/2021   Sexual activity: Yes    Birth control/protection: None  Other Topics Concern   Not on file  Social History Narrative   ** Merged History Encounter **       Social Determinants of Health   Financial Resource Strain: Low Risk    Difficulty of Paying Living Expenses: Not very hard  Food Insecurity: Food Insecurity Present   Worried About Running Out of Food in the Last Year: Never true   [employer] of  Food in the Last Year: Sometimes true  Transportation Needs: No Transportation Needs   Lack of Transportation (Medical): No   Lack of Transportation (Non-Medical): No  Physical Activity: Sufficiently Active   Days of Exercise per Week: 5 days   Minutes of Exercise per Session: 30 min  Stress: No Stress Concern Present   Feeling of Stress : Only a little  Social Connections: Socially Isolated   Frequency of Communication with Friends and Family: Once a week   Frequency of Social Gatherings with Friends and Family: Once a week   Attends Religious Services: 1 to 4 times per year   Active Member of [employer] or Organizations: No   Attends Music therapist: Never    Marital Status: Never married    Family History  Problem Relation Age of Onset   Stroke Maternal Grandmother    Hypertension Maternal Grandmother    COPD Maternal Grandmother    Prostate cancer Maternal Grandfather    Hypertension Mother     No Known Allergies  Medications Prior to Admission  Medication Sig Dispense Refill Last Dose   acetaminophen (TYLENOL) 500 MG tablet Take 500 mg by mouth every 6 (six) hours as needed. (Patient not taking: Reported on 03/25/2021)      buprenorphine (SUBUTEX) 8 MG SUBL SL tablet SMARTSIG:0.5 Tablet(s) Sublingual 3 Times Daily      Prenatal Vit-Fe Fumarate-FA (PNV PRENATAL PLUS MULTIVITAMIN) 27-1 MG TABS Take 1 tablet by mouth daily. (Patient not taking: Reported on 03/25/2021) 30 tablet 11     Review of Systems - Negative except vaginal bleeding & abdominal cramping  Vitals:  BP 133/88   Pulse 74   Temp 98 F (36.7 C) (Oral)   Resp 18   Ht 5\' 1"  (1.549 m)   Wt 69.3 kg   LMP 12/30/2020 (Approximate)   SpO2 99%   BMI 28.85 kg/m  Physical Examination: CONSTITUTIONAL: Well-developed, well-nourished female in no acute distress.  HENT:  Normocephalic, atraumatic, External right and left ear normal. Oropharynx is clear and moist EYES: Conjunctivae and EOM are normal. Pupils are equal, round, and reactive to light. No scleral icterus.  NECK: Normal range of motion, supple, no masses SKIN: Skin is warm and dry. No rash noted. Not diaphoretic. No erythema. No pallor. [place]: Alert and oriented to person, place, and time. Normal reflexes, muscle tone coordination. No cranial nerve deficit noted. PSYCHIATRIC: Normal mood and affect. Normal behavior. Normal judgment and thought content. CARDIOVASCULAR: Normal heart rate noted, regular rhythm RESPIRATORY: Effort and breath sounds normal, no problems with respiration noted ABDOMEN: Soft, nontender, nondistended, gravid. MUSCULOSKELETAL: Normal range of motion. No edema and no tenderness. 2+ distal  pulses. PELVIC: moderate amount of dark red blood coming from os, cleared out with 8 fox swabs. Cervix visually closed. Digital exam deferred since unknown placenta location  Cervix:  visually closed Membranes:intact Fetal Monitoring:Baseline: 150 bpm, Variability: Good {> 6 bpm), Accelerations: 10x10, and Decelerations: spontaneous  Tocometer: Flat  Labs:  Results for orders placed or performed during the hospital encounter of 08/21/21 (from the past 24 hour(s))  Urinalysis, Routine w reflex microscopic Urine, Clean Catch   Collection Time: 08/21/21 12:08 PM  Result Value Ref Range   Color, Urine RED (A) YELLOW   APPearance CLOUDY (A) CLEAR   Specific Gravity, Urine 1.025 1.005 - 1.030   pH 6.5 5.0 - 8.0   Glucose, UA NEGATIVE NEGATIVE mg/dL   Hgb urine dipstick LARGE (A) NEGATIVE   Bilirubin Urine NEGATIVE NEGATIVE  Ketones, ur 15 (A) NEGATIVE mg/dL   Protein, ur 332 (A) NEGATIVE mg/dL   Nitrite POSITIVE (A) NEGATIVE   Leukocytes,Ua NEGATIVE NEGATIVE  Protein / creatinine ratio, urine   Collection Time: 08/21/21 12:08 PM  Result Value Ref Range   Creatinine, Urine 284.22 mg/dL   Total Protein, Urine 88 mg/dL   Protein Creatinine Ratio 0.31 (H) 0.00 - 0.15 mg/mg[Cre]  Urinalysis, Microscopic (reflex)   Collection Time: 08/21/21 12:08 PM  Result Value Ref Range   RBC / HPF >50 0 - 5 RBC/hpf   WBC, UA 0-5 0 - 5 WBC/hpf   Bacteria, UA NONE SEEN NONE SEEN   Squamous Epithelial / LPF 0-5 0 - 5   Mucus PRESENT   CBC   Collection Time: 08/21/21 12:11 PM  Result Value Ref Range   WBC 12.8 (H) 4.0 - 10.5 K/uL   RBC 3.73 (L) 3.87 - 5.11 MIL/uL   Hemoglobin 11.7 (L) 12.0 - 15.0 g/dL   HCT 95.1 (L) 88.4 - 16.6 %   MCV 93.3 80.0 - 100.0 fL   MCH 31.4 26.0 - 34.0 pg   MCHC 33.6 30.0 - 36.0 g/dL   RDW 06.3 01.6 - 01.0 %   Platelets 236 150 - 400 K/uL   nRBC 0.0 0.0 - 0.2 %  Comprehensive metabolic panel   Collection Time: 08/21/21 12:11 PM  Result Value Ref Range   Sodium 135  135 - 145 mmol/L   Potassium 3.9 3.5 - 5.1 mmol/L   Chloride 104 98 - 111 mmol/L   CO2 24 22 - 32 mmol/L   Glucose, Bld 86 70 - 99 mg/dL   BUN 9 6 - 20 mg/dL   Creatinine, Ser 9.32 0.44 - 1.00 mg/dL   Calcium 8.4 (L) 8.9 - 10.3 mg/dL   Total Protein 6.3 (L) 6.5 - 8.1 g/dL   Albumin 2.3 (L) 3.5 - 5.0 g/dL   AST 24 15 - 41 U/L   ALT 23 0 - 44 U/L   Alkaline Phosphatase 112 38 - 126 U/L   Total Bilirubin 0.3 0.3 - 1.2 mg/dL   GFR, Estimated >35 >57 mL/min   Anion gap 7 5 - 15  Wet prep, genital   Collection Time: 08/21/21 12:14 PM   Specimen: Urine, Clean Catch  Result Value Ref Range   Yeast Wet Prep HPF POC NONE SEEN NONE SEEN   Trich, Wet Prep NONE SEEN NONE SEEN   Clue Cells Wet Prep HPF POC NONE SEEN NONE SEEN   WBC, Wet Prep HPF POC <10 <10   Sperm NONE SEEN   Type and screen   Collection Time: 08/21/21 12:25 PM  Result Value Ref Range   ABO/RH(D) A POS    Antibody Screen NEG    Sample Expiration      08/24/2021,2359 Performed at [facility], [address] [place], [place] [address]   Resp Panel by RT-PCR (Flu A&B, Covid) Nasopharyngeal Swab   Collection Time: 08/21/21  1:08 PM   Specimen: Nasopharyngeal Swab; Nasopharyngeal(NP) swabs in vial transport medium  Result Value Ref Range   SARS Coronavirus 2 by RT PCR NEGATIVE NEGATIVE   Influenza A by PCR NEGATIVE NEGATIVE   Influenza B by PCR NEGATIVE NEGATIVE    Imaging Studies: No results found.   Assessment and Plan: 1. [redacted] weeks gestation of pregnancy  -given BMZ -Dr. Joycelyn Man (neo) notified of admission  2. Vaginal bleeding in pregnancy, third trimester  -preliminary ultrasound reports shows breech presentation, ?abruption  vs previa  3. No prenatal care in current pregnancy in third trimester   4. Preeclampsia, third trimester -elevated BPs, none severe range & no symptoms -labs ok, PCR slightly elevated (?d/t vaginal bleeding)    Jorje Guild, NP 08/21/2021 2:40 PM

## 2021-08-22 ENCOUNTER — Encounter (HOSPITAL_COMMUNITY): Payer: Self-pay | Admitting: Obstetrics and Gynecology

## 2021-08-22 ENCOUNTER — Inpatient Hospital Stay (HOSPITAL_BASED_OUTPATIENT_CLINIC_OR_DEPARTMENT_OTHER): Payer: Medicaid Other

## 2021-08-22 DIAGNOSIS — O98413 Viral hepatitis complicating pregnancy, third trimester: Secondary | ICD-10-CM

## 2021-08-22 DIAGNOSIS — O4693 Antepartum hemorrhage, unspecified, third trimester: Secondary | ICD-10-CM

## 2021-08-22 DIAGNOSIS — Z3689 Encounter for other specified antenatal screening: Secondary | ICD-10-CM

## 2021-08-22 DIAGNOSIS — O30043 Twin pregnancy, dichorionic/diamniotic, third trimester: Secondary | ICD-10-CM | POA: Diagnosis not present

## 2021-08-22 DIAGNOSIS — O365930 Maternal care for other known or suspected poor fetal growth, third trimester, not applicable or unspecified: Secondary | ICD-10-CM

## 2021-08-22 DIAGNOSIS — Z3A33 33 weeks gestation of pregnancy: Secondary | ICD-10-CM

## 2021-08-22 DIAGNOSIS — O99323 Drug use complicating pregnancy, third trimester: Secondary | ICD-10-CM

## 2021-08-22 DIAGNOSIS — B182 Chronic viral hepatitis C: Secondary | ICD-10-CM

## 2021-08-22 DIAGNOSIS — O321XX1 Maternal care for breech presentation, fetus 1: Secondary | ICD-10-CM

## 2021-08-22 DIAGNOSIS — F1110 Opioid abuse, uncomplicated: Secondary | ICD-10-CM

## 2021-08-22 LAB — RUBELLA SCREEN: Rubella: 3.71 index (ref >0.99)

## 2021-08-22 LAB — CULTURE, OB URINE
Culture: <10000 — AB
Special Requests: NORMAL

## 2021-08-22 LAB — RPR: RPR Ser Ql: NONREACTIVE

## 2021-08-22 LAB — HEPATITIS C ANTIBODY: HCV Ab: REACTIVE — AB

## 2021-08-22 MED ORDER — CLONIDINE HCL 0.1 MG PO TABS
0.1000 mg | ORAL_TABLET | Freq: Two times a day (BID) | ORAL | Status: DC
  Administered 2021-08-22 – 2021-08-26 (×7): 0.1 mg via ORAL
  Filled 2021-08-22 (×10): qty 1

## 2021-08-22 NOTE — Progress Notes (Signed)
Patient denies contractions, states seeing small blood clot in toilet last night, otherwise small amount of bleeding on pad this AM. Monitors applied and assessing. Patient states feeling like she "feels drug sick" but would like to wait on medication at this time.

## 2021-08-22 NOTE — Progress Notes (Signed)
Patient ID: [name], female   DOB: [birth year], 25 y.o.   MRN: [id number] FACULTY PRACTICE ANTEPARTUM(COMPREHENSIVE) NOTE  [name] is a 25 y.o. G2P1001 with Estimated Date of Delivery: 10/06/21   By  early ultrasound [redacted]w[redacted]d  who is admitted for 3rd trimester bleeding, sonogram unclear if marginal previa vs abruption or both  Fetal presentation is breech. Length of Stay:  1  Days  Date of admission:08/21/2021  Subjective: Diminished bleeding this am Patient reports the fetal movement as active. Patient reports uterine contraction  activity as none. Patient reports  vaginal bleeding as none. Patient describes fluid per vagina as None.  Vitals:  Blood pressure 139/80, pulse 85, temperature 98 F (36.7 C), temperature source Oral, resp. rate 16, height  (1.549 m), weight 69.3 kg, last menstrual period 12/30/2020, SpO2 99 %. Vitals:   08/21/21 1949 08/21/21 2321 08/22/21 0358 08/22/21 0731  BP:  (!) 138/97 124/78 139/80  Pulse:  92 82 85  Resp:  Temp: 98.4 F (36.9 C)  98.4 F (36.9 C) 98 F (36.7 C)  TempSrc: Oral  Oral Oral  SpO2:  100% 99% 99%  Weight:      Height:       Physical Examination:  General appearance - alert, well appearing, and in no distress Abdomen - soft, nontender, nondistended, no masses or organomegaly Fundal Height:  size less than dates Pelvic Exam:  examination not indicated Cervical Exam: Not evaluated. Extremities: extremities normal, atraumatic, no cyanosis or edema with DTRs 2+ bilaterally Membranes:intact  Fetal Monitoring:  Baseline: 140s bpm, Variability: Good {> 6 bpm), Accelerations: Reactive, and Decelerations: Variable: moderate   reactive  Labs:  Results for orders placed or performed during the hospital encounter of 08/21/21 (from the past 24 hour(s))  Urinalysis, Routine w reflex microscopic Urine, Clean Catch   Collection Time: 08/21/21 12:08 PM  Result Value Ref Range   Color, Urine RED (A) YELLOW   APPearance  CLOUDY (A) CLEAR   Specific Gravity, Urine 1.025 1.005 - 1.030   pH 6.5 5.0 - 8.0   Glucose, UA NEGATIVE NEGATIVE mg/dL   Hgb urine dipstick LARGE (A) NEGATIVE   Bilirubin Urine NEGATIVE NEGATIVE   Ketones, ur 15 (A) NEGATIVE mg/dL   Protein, ur 865 (A) NEGATIVE mg/dL   Nitrite POSITIVE (A) NEGATIVE   Leukocytes,Ua NEGATIVE NEGATIVE  Protein / creatinine ratio, urine   Collection Time: 08/21/21 12:08 PM  Result Value Ref Range   Creatinine, Urine 284.22 mg/dL   Total Protein, Urine 88 mg/dL   Protein Creatinine Ratio 0.31 (H) 0.00 - 0.15 mg/mg[Cre]  Urinalysis, Microscopic (reflex)   Collection Time: 08/21/21 12:08 PM  Result Value Ref Range   RBC / HPF >50 0 - 5 RBC/hpf   WBC, UA 0-5 0 - 5 WBC/hpf   Bacteria, UA NONE SEEN NONE SEEN   Squamous Epithelial / LPF 0-5 0 - 5   Mucus PRESENT   CBC   Collection Time: 08/21/21 12:11 PM  Result Value Ref Range   WBC 12.8 (H) 4.0 - 10.5 K/uL   RBC 3.73 (L) 3.87 - 5.11 MIL/uL   Hemoglobin 11.7 (L) 12.0 - 15.0 g/dL   HCT 78.4 (L) 69.6 - 29.5 %   MCV 93.3 80.0 - 100.0 fL   MCH 31.4 26.0 - 34.0 pg   MCHC 33.6 30.0 - 36.0 g/dL   RDW 28.4 13.2 - 44.0 %   Platelets 236 150 - 400 K/uL   nRBC  0.0 0.0 - 0.2 %  Comprehensive metabolic panel   Collection Time: 08/21/21 12:11 PM  Result Value Ref Range   Sodium 135 135 - 145 mmol/L   Potassium 3.9 3.5 - 5.1 mmol/L   Chloride 104 98 - 111 mmol/L   CO2 24 22 - 32 mmol/L   Glucose, Bld 86 70 - 99 mg/dL   BUN 9 6 - 20 mg/dL   Creatinine, Ser 4.09 0.44 - 1.00 mg/dL   Calcium 8.4 (L) 8.9 - 10.3 mg/dL   Total Protein 6.3 (L) 6.5 - 8.1 g/dL   Albumin 2.3 (L) 3.5 - 5.0 g/dL   AST 24 15 - 41 U/L   ALT 23 0 - 44 U/L   Alkaline Phosphatase 112 38 - 126 U/L   Total Bilirubin 0.3 0.3 - 1.2 mg/dL   GFR, Estimated >81 >19 mL/min   Anion gap 7 5 - 15  HIV Antibody (routine testing w rflx)   Collection Time: 08/21/21 12:11 PM  Result Value Ref Range   HIV Screen 4th Generation wRfx Non Reactive Non  Reactive  Hepatitis C antibody   Collection Time: 08/21/21 12:11 PM  Result Value Ref Range   HCV Ab Reactive (A) NON REACTIVE  Hepatitis B surface antigen   Collection Time: 08/21/21 12:12 PM  Result Value Ref Range   Hepatitis B Surface Ag NON REACTIVE NON REACTIVE  Wet prep, genital   Collection Time: 08/21/21 12:14 PM   Specimen: Urine, Clean Catch  Result Value Ref Range   Yeast Wet Prep HPF POC NONE SEEN NONE SEEN   Trich, Wet Prep NONE SEEN NONE SEEN   Clue Cells Wet Prep HPF POC NONE SEEN NONE SEEN   WBC, Wet Prep HPF POC <10 <10   Sperm NONE SEEN   Type and screen   Collection Time: 08/21/21 12:25 PM  Result Value Ref Range   ABO/RH(D) A POS    Antibody Screen NEG    Sample Expiration      08/24/2021,2359 Performed at [facility], [address], [place], [place] [address]   Resp Panel by RT-PCR (Flu A&B, Covid) Nasopharyngeal Swab   Collection Time: 08/21/21  1:08 PM   Specimen: Nasopharyngeal Swab; Nasopharyngeal(NP) swabs in vial transport medium  Result Value Ref Range   SARS Coronavirus 2 by RT PCR NEGATIVE NEGATIVE   Influenza A by PCR NEGATIVE NEGATIVE   Influenza B by PCR NEGATIVE NEGATIVE    Imaging Studies:    [place] MFM OB COMP + 14 WK  Result Date: 08/21/2021 ----------------------------------------------------------------------  OBSTETRICS REPORT                        (Signed Final 08/21/2021 09:28 pm) ---------------------------------------------------------------------- Patient Info  ID #:       [id number]                          D.O.B.:  1996-05-27 (25 yrs)  Name:       [name]                   Visit Date: 08/21/2021 01:34 pm ---------------------------------------------------------------------- Performed By  Attending:        Lin Landsman      Ref. Address:      [address]                    [address]  Performed By:     Birdena Crandall        [facility]  Referred By:      Judeth Horn                    CNM ---------------------------------------------------------------------- Orders  #  Description                           Code        Ordered By  1  [place] MFM OB COMP + 14 WK                76805.01    Judeth Horn ----------------------------------------------------------------------  #  Order #                     Accession #                Episode #  1  [id number]                   [id number]                 [id number] ---------------------------------------------------------------------- Indications  Vaginal bleeding in pregnancy, third trimester  O46.93  Insufficient Prenatal Care                      O09.30  Late to prenatal care, third trimester          O09.33  Heroin/Methadone use                            O99.320 F11.10  Chronic Hepatitis C complicating pregnancy,     O98.419 B18.2  antepartum  Twin pregnancy, di/di, unspecified trimester    O8.049  (vanishing twin) twin demise  108 weeks gestation of pregnancy                 Z3A.33 ---------------------------------------------------------------------- Fetal Evaluation  Num Of Fetuses:          1  Fetal Heart Rate(bpm):   147  Cardiac Activity:        Observed  Presentation:            Breech  Placenta:                Left lateral  Amniotic Fluid  AFI FV:      Within normal limits  AFI Sum(cm)     %Tile       Largest Pocket(cm)  12.7            38          4.4  RUQ(cm)       RLQ(cm)       LUQ(cm)        LLQ(cm)  4.4           3.4           3.4            1.5  Comment:    Enlarged, heterogeneous  Placenta, Extending from fundus to              internal os.    (placental Abruption?) and Placenta previa totalis              vs Marginalis (due to enlaregemert) ---------------------------------------------------------------------- Biometry  BPD:      76.5  mm     G. Age:  30w 5d          1  %    CI:        76.07   %    70 - 86                                                           FL/HC:       19.2  %    19.9 - 21.5  HC:        278  mm     G. Age:  30w 3d        < 1  %    HC/AC:       1.07       0.96 - 1.11  AC:      259.8  mm     G. Age:  30w 1d        < 1  %    FL/BPD:      69.8  %    71 - 87  FL:       53.4  mm     G. Age:  28w 2d        < 1  %    FL/AC:       20.6  %    20 - 24  Est. FW:    1425   gm     3 lb 2 oz    < 1  % ---------------------------------------------------------------------- OB History  Gravidity:    2  Living:       1 ---------------------------------------------------------------------- Gestational Age  Clinical EDD:  33w 3d                                        EDD:   10/06/21  U/S Today:     29w 6d                                        EDD:   10/31/21  Best:          33w 3d     Det. By:  Clinical EDD             EDD:   10/06/21 ---------------------------------------------------------------------- Anatomy  Bladder:               Appears normal ---------------------------------------------------------------------- Cervix Uterus Adnexa  Cervix  Length:              4  cm.  Normal appearance by transabdominal scan. ---------------------------------------------------------------------- Impression  Limited exam to assess vaginal bleeding and maternal history  of substance abuse.  Good fetal movement and amniotic fluid  Fetal growth suggest severe fetal growth restriction with an  EFW 1%.  UA Dopplers not  performed as this was a bedside exam and  the sonographer was not aware of the fetal growth at the time  of the exam.  There is a large heterogenous structure at the cervix and  connected to the placenta. It is uncertain if this is a placental  abruption vs previa. ---------------------------------------------------------------------- Recommendations  UA Dopplers at first available  Repeat anatomy if not previously performed  Clinical correlation recommended.  ----------------------------------------------------------------------               Lin Landsman, MD Electronically Signed Final Report   08/21/2021 09:28 pm ----------------------------------------------------------------------    Medications:  Scheduled  betamethasone acetate-betamethasone sodium phosphate  12 mg Intramuscular Q24H   docusate sodium  100 mg Oral Daily   influenza vac split quadrivalent PF  0.5 mL Intramuscular Tomorrow-1000   nicotine  14 mg Transdermal QHS   prenatal multivitamin  1 tablet Oral Q1200   I have reviewed the patient's current medications.  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 coli sepsis (HCC) 02/21/2015   E. coli UTI 02/21/2015   Suicide attempt by substance overdose (HCC)    Suicide attempt by drug ingestion (HCC) 12/30/2014   MDD (major depressive disorder), recurrent episode, severe (HCC) 12/29/2014   Vaginal delivery 04/16/2014   Illicit drug use 09/03/2013    PLAN: >BMZ 2nd dose today >Dopplers ordered for evaluation of the FGR >Continue suboxone and nicotine >Cont in house observation  Delivery if deterioration in fetal status or if bleeding becomes more significant  BREECH--->Caesarean delivery  Jelina Paulsen H Molleigh Huot 08/22/2021,7:53 AM

## 2021-08-23 ENCOUNTER — Encounter (HOSPITAL_COMMUNITY): Payer: Self-pay | Admitting: Obstetrics and Gynecology

## 2021-08-23 DIAGNOSIS — B1920 Unspecified viral hepatitis C without hepatic coma: Secondary | ICD-10-CM

## 2021-08-23 DIAGNOSIS — F1120 Opioid dependence, uncomplicated: Secondary | ICD-10-CM | POA: Diagnosis present

## 2021-08-23 HISTORY — DX: Unspecified viral hepatitis C without hepatic coma: B19.20

## 2021-08-23 LAB — GC/CHLAMYDIA PROBE AMP (~~LOC~~) NOT AT ARMC
Chlamydia: NEGATIVE
Comment: NEGATIVE
Comment: NORMAL
Neisseria Gonorrhea: NEGATIVE

## 2021-08-23 LAB — HEMOGLOBIN A1C
Hgb A1c MFr Bld: 5.5 % (ref 4.8–5.6)
Mean Plasma Glucose: 111 mg/dL

## 2021-08-23 LAB — CULTURE, BETA STREP (GROUP B ONLY)

## 2021-08-23 MED ORDER — LOPERAMIDE HCL 2 MG PO CAPS: 2.0000 mg | ORAL_CAPSULE | ORAL | Status: DC | PRN

## 2021-08-23 MED ORDER — DICYCLOMINE HCL 10 MG PO CAPS
10.0000 mg | ORAL_CAPSULE | Freq: Three times a day (TID) | ORAL | Status: DC | PRN
  Filled 2021-08-23: qty 1

## 2021-08-23 MED ORDER — METHADONE HCL 10 MG/ML PO CONC
10.0000 mg | ORAL | Status: DC | PRN
  Administered 2021-08-23: 10 mg via ORAL
  Filled 2021-08-23: qty 5

## 2021-08-23 MED ORDER — BUPRENORPHINE HCL-NALOXONE HCL 8-2 MG SL SUBL: 1.0000 | SUBLINGUAL_TABLET | Freq: Three times a day (TID) | SUBLINGUAL | Status: DC

## 2021-08-23 MED ORDER — ONDANSETRON 4 MG PO TBDP: 8.0000 mg | ORAL_TABLET | Freq: Three times a day (TID) | ORAL | Status: DC | PRN

## 2021-08-23 MED ORDER — NIFEDIPINE ER OSMOTIC RELEASE 30 MG PO TB24
30.0000 mg | ORAL_TABLET | Freq: Every day | ORAL | Status: DC
  Administered 2021-08-23 – 2021-08-26 (×4): 30 mg via ORAL
  Filled 2021-08-23 (×4): qty 1

## 2021-08-23 MED ORDER — BUPRENORPHINE HCL-NALOXONE HCL 2-0.5 MG SL SUBL: 1.0000 | SUBLINGUAL_TABLET | SUBLINGUAL | Status: DC | PRN

## 2021-08-23 MED ORDER — BUSPIRONE HCL 10 MG PO TABS
10.0000 mg | ORAL_TABLET | Freq: Three times a day (TID) | ORAL | Status: DC
  Administered 2021-08-23 – 2021-08-26 (×8): 10 mg via ORAL
  Filled 2021-08-23 (×12): qty 1

## 2021-08-23 MED ORDER — BUPRENORPHINE HCL-NALOXONE HCL 8-2 MG SL SUBL: 1.0000 | SUBLINGUAL_TABLET | Freq: Two times a day (BID) | SUBLINGUAL | Status: DC

## 2021-08-23 NOTE — Clinical SW OB High Risk (Signed)
OB Specialty Care  Clinical Social Worker:  Dimple Nanas, [address] Date/Time:  08/23/2021, 12:46 PM Gestational Age on Admission:  25 y.o. Admitting Diagnosis: 08/21/2021   Expected Delivery Date:  10/13/21  [facility] Environment  Home Address: [address] [place], [place] [address]  Household Member/Support Name:  MOB reports that she resides with  Relationship:  Mother (Patient's mom number is Scientist, research (physical sciences)) Other Support:  None    Psychosocial Data  Employment:  Unemployed  Type of Work:    Education: n/a    Nurse, mental health Care:  None reported   Strengths/Weaknesses/Factors to Consider  Concerns Related to Hospitalization:  none reported; CSW reviewed hospital substance exposure policy and pt was understanding.    Previous Pregnancies/Feelings Towards Pregnancy?  Concerns related to being/becoming a mother?:  Pt reports feeling prepared to parent infant however is concerned about CPS involvement due to pt current substance use. CSW reviewed CPS investigation process and will continue to provide support post delivery.   Social Support (FOB? Who is/will be helping with baby/other kids?): Pt reported that FOB will not be involved however his name is Izola Price [phone]).   Recent Stressful Life Events (life changes in past year?):  Relapse from Heroin. Pt has not been successful with finding a residential facility.    Prenatal Care/Education/Home Preparations: Pt reports having all essential items to care for infant.   Domestic Violence (of any type):  No If Yes to Domestic Violence, Describe/Action Plan:  N/A   Substance Use During Pregnancy: Yes (Inpatient Substance Use treatment programs were provided to patient.  Per patient she is currently on the waitng list for [facility].)   Clinical Assessment/Plan:  CSW met with Pt and Pt's mother  Standley Dakins) at pt's bedside in room 105.  When CSW arrived, pt was resting in  bed and pt's mother was resting on the couch.  CSW explained CSW's role and pt gave CSW permission to complete assessment while pt's mother was present. Pt's mother engaged with CSW and appeared to be a good support for pt.  Pt was easy to engage.  Although she was tearful during most of the session, she was forthcoming and receptive to meeting with CSW.   CSW asked about MOB's MH hx and MOB acknowledged suicide attempt in 2016 however reference, "I was just crazy in love and was doing crazy stuff.  I don't think I have a dx of depression."  CSW asked about MOB's substance hx and MOB reported the use of Heroin for the past year.  Per MOB, her last use was on 12.10.22.  MOB reports that she is currently on the waiting list for [facility].  MOB was open to resources for other residential facilities and agreed to make contact while inpatient. CSW explained hospital policy regarding infant substance exposure.  MOB was understanding and asked questions about CPS involvement.  CSW explained CPS protocol and encourage to ask questions as they arise.   CSW provide MOB with local peds list.   CSW will continue to offer resources and supports to MOB while she remains inpatient.  MOB is also aware that infant goes to NICU, CSW will continue to provide resources and supports.   Laurey Arrow, MSW, LCSW Clinical Social Work [phone]

## 2021-08-23 NOTE — Progress Notes (Signed)
Went to see patient to discuss options for medication assisted treatment for her opioid use disorder.  Reports she has cut back significantly on opioid use in the last several months, only using around $40 or a 1/2 gram a day. Use primarily intranasal, thinks she may have shared straws with others. Intermittent use of methamphetamines but none recently. Reports her last use of opioids was on 08/21/21 in the evening.   Discussed options in depth including both suboxone and methadone. Patient ultimately elects for methadone, she and her mother are confident they can arrange daily transportation to a clinic.   Given relatively low amount she was using previously I don't think she'll need a very high dose of methadone, so will plan to start her out with PRN doses.   Plan:  - methadone 10 mg q1 hr PRN up to 40mg  total in a 24h period - after that should be given total amount from previous 24h period as a single dose each morning - above 40 mg dose increases should only be done every 48-72 hours and only by 5-10 mg to avoid delayed oversedation/overdose - screening ECG ordered - clonidine prn for anxiety - zofran prn for nausea - bentyl prn for abdominal pain - immodium prn

## 2021-08-23 NOTE — Progress Notes (Signed)
Patient ID: [name], female   DOB: 1996-07-11, 25 y.o.   MRN: [id number] FACULTY PRACTICE ANTEPARTUM(COMPREHENSIVE) NOTE  [name] is a 25 y.o. G2P1001 with Estimated Date of Delivery: 10/06/21   By  early ultrasound [redacted]w[redacted]d  who is admitted for 3rd trimester bleeding, marginal previa with placental abruption.   Also with FGR, severe, <1% with excellent fetal Doppler flow  Fetal presentation is breech. Length of Stay:  2  Days  Date of admission:08/21/2021  Subjective: Pt states the bleeding is now brown and just a smudge, no active bleeding at all Patient reports the fetal movement as active. Patient reports uterine contraction  activity as none. Patient reports  vaginal bleeding as scant staining. Patient describes fluid per vagina as None.  Vitals:  Blood pressure (!) 141/89, pulse 75, temperature 97.7 F (36.5 C), temperature source Oral, resp. rate 18, height 5\' 1"  (1.549 m), weight 69.3 kg, last menstrual period 12/30/2020, SpO2 100 %. Vitals:   08/22/21 1930 08/22/21 2317 08/23/21 0511 08/23/21 0720  BP: (!) 143/93 (!) 147/93 132/81 (!) 141/89  Pulse: (!) 114 82 80 75  Resp: 18 20 18 18   Temp: 98.4 F (36.9 C) 98.2 F (36.8 C) 98.1 F (36.7 C) 97.7 F (36.5 C)  TempSrc: Oral Oral Oral Oral  SpO2: 100% 100% 100% 100%  Weight:      Height:       Physical Examination:  General appearance - alert, well appearing, and in no distress Abdomen - soft, nontender, nondistended, no masses or organomegaly Fundal Height:  size less than dates Pelvic Exam:  examination not indicated Cervical Exam: Not evaluated. Extremities: extremities normal, atraumatic, no cyanosis or edema with DTRs 2+ bilaterally Membranes:intact  Fetal Monitoring:  Baseline: 140s bpm, Variability: Good {> 6 bpm), Accelerations: Reactive, and Decelerations: rare variable    reactive  Labs:  No results found for this or any previous visit (from the past 24 hour(s)).  Imaging Studies:    [place] MFM OB  DETAIL +14 WK  Result Date: 08/23/2021 ----------------------------------------------------------------------  OBSTETRICS REPORT                        (Signed Final 08/23/2021 07:06 am) ---------------------------------------------------------------------- Patient Info  ID #:       [id number]                          D.O.B.:  1995-11-01 (25 yrs)  Name:       [name]                   Visit Date: 08/22/2021 01:14 pm ---------------------------------------------------------------------- Performed By  Attending:        Lin Hickman      Ref. Address:      [address]                    [place]                                                              [address]  Performed By:     [name]        [facility]  Referred By:      [name]                    CNM ---------------------------------------------------------------------- Orders  #  Description                           Code        Ordered By  1  [place] MFM UA CORD DOPPLER                76820.02    [name]  2  [place] MFM OB DETAIL +14 WK               76811.01    [name] ----------------------------------------------------------------------  #  Order #                     Accession #                Episode #  1  [id number]                   [id number]                 [id number]  2  [id number]                   [id number]                 [id number] ---------------------------------------------------------------------- Indications  Vaginal bleeding in pregnancy, third trimester  O46.93  Insufficient Prenatal Care                      O09.30  Late to prenatal care, third trimester          O09.33  Heroin/Methadone use                            O99.320 F11.10  Chronic Hepatitis C complicating pregnancy,     O98.419 B18.2  antepartum  Twin pregnancy, di/di, unspecified trimester    O39.049  (vanishing twin) twin demise  [redacted] weeks gestation of pregnancy                  Z3A.33  Maternal care for excessive fetal growth,       O36.63X0  third trimester, fetus unspecified ---------------------------------------------------------------------- Fetal Evaluation  Num Of Fetuses:          1  Fetal Heart Rate(bpm):   157  Cardiac Activity:        Observed  Presentation:            Breech  Placenta:                Left lateral,large heterogeneous  Amniotic Fluid  AFI FV:      Within normal limits  AFI Sum(cm)     %Tile       Largest Pocket(cm)  10.7            24          3.2  RUQ(cm)       RLQ(cm)       LUQ(cm)        LLQ(cm)  3  3.2           2.8            1.7  Comment:    Large placental hemorrhage  measuring :16x11.7x9 cm . No              extenstion to the internal cervical os.                    No Placenta              Previa seen today ---------------------------------------------------------------------- Biometry  BPD:      76.5  mm     G. Age:  30w 5d        < 1  %    CI:        76.07   %    70 - 86                                                          FL/HC:       19.2  %    19.4 - 21.8  HC:        278  mm     G. Age:  30w 3d        < 1  %    HC/AC:       1.07       0.96 - 1.11  AC:      259.8  mm     G. Age:  30w 1d        < 1  %    FL/BPD:      69.8  %    71 - 87  FL:       53.4  mm     G. Age:  28w 2d        < 1  %    FL/AC:       20.6  %    20 - 24  Est. FW:    1425   gm     3 lb 2 oz    < 1  % ---------------------------------------------------------------------- OB History  Gravidity:    2  Living:       1 ---------------------------------------------------------------------- Gestational Age  Clinical EDD:  33w 4d                                        EDD:   10/06/21  U/S Today:     29w 6d                                        EDD:   11/01/21  Best:          33w 4d     Det. By:  Clinical EDD             EDD:   10/06/21 ---------------------------------------------------------------------- Anatomy  Cranium:               Appears normal          Aortic Arch:            Appears normal  Cavum:                 Appears normal         Ductal Arch:            Appears normal  Ventricles:            Appears normal         Diaphragm:              Appears normal  Choroid Plexus:        Appears normal         Stomach:                Appears normal, left                                                                        sided  Cerebellum:            Appears normal         Abdomen:                Appears normal  Posterior Fossa:       Appears normal         Abdominal Wall:         Appears nml (cord                                                                        insert, abd wall)  Face:                  Appears normal         Cord Vessels:           Appears normal (3                         (orbits and profile)                           vessel cord)  Lips:                  Appears normal         Kidneys:                Appear normal  Thoracic:              Appears normal         Bladder:                Appears normal  Heart:                 Appears normal         Spine:                  Ltd views no                         (  4CH, axis, and                                intracranial signs of                         situs)                                         NTD  RVOT:                  Appears normal         Upper Extremities:      RT visualized. LT                                                                        limited.  LVOT:                  Not well visualized    Lower Extremities:      Visualized  Other:  Lt ulna/radius seen, Hand and humerus not clearly seen.  Fetus          appears to be female. Good breathing . ---------------------------------------------------------------------- Doppler - Fetal Vessels  Umbilical Artery    S/D    %tile      RI    %tile      PI    %tile            ADFV    RDFV    1.9       10    0.47      4.5    0.62      4.5               No      No ----------------------------------------------------------------------  Cervix Uterus Adnexa  Cervix  Length:              3  cm.  Normal appearance by transabdominal scan. No placental coverage  seen.  Uterus  No abnormality visualized. ---------------------------------------------------------------------- Impression  Limited exam to assess vaginal bleeding and maternal history  of substance abuse.  Normal anatomy is seen today with measurements again  consistent with severe fetal growth restriction <1%  Good fetal movement and amniotic fluid  Suboptimal views of the fetal anatomy are seen.  The UA Dopplers demonstrated forward flow without  evidence of AEDF or REDF.  The large heterogenous posterior structure, suggestive of  retroplacental bleed, is again seen the cervix appears >2 cm  from this structure. ---------------------------------------------------------------------- Recommendations  Continue daily NST  2x weekly UA Dopplers and BPP.  Repeat growth in 3 weeks. ----------------------------------------------------------------------               Heather Landsman, MD Electronically Signed Final Report   08/23/2021 07:06 am ----------------------------------------------------------------------  [place] MFM OB COMP + 14 WK  Result Date: 08/21/2021 ----------------------------------------------------------------------  OBSTETRICS REPORT                        (Signed Final 08/21/2021 09:28 pm) ----------------------------------------------------------------------  Patient Info  ID #:       [id number]                          D.O.B.:  [birth year] (25 yrs)  Name:       [name]                   Visit Date: 08/21/2021 01:34 pm ---------------------------------------------------------------------- Performed By  Attending:        Lin Hickman      Ref. Address:      [address]                    [place]                                                              [address]  Performed By:     [name]        [facility]  Referred By:      [name]                    CNM ---------------------------------------------------------------------- Orders  #  Description                           Code        Ordered By  1  [place] MFM OB COMP + 14 WK                76805.01    [name] ----------------------------------------------------------------------  #  Order #                     Accession #                Episode #  1  [id number]                   [id number]                 [id number] ---------------------------------------------------------------------- Indications  Vaginal bleeding in pregnancy, third trimester  O46.93  Insufficient Prenatal Care                      O09.30  Late to prenatal care, third trimester          O09.33  Heroin/Methadone use                            O99.320 F11.10  Chronic Hepatitis C complicating pregnancy,     O98.419 B18.2  antepartum  Twin pregnancy, di/di, unspecified trimester    O63.049  (vanishing twin) twin demise  109 weeks gestation of pregnancy                 Z3A.33 ---------------------------------------------------------------------- Fetal Evaluation  Num Of Fetuses:          1  Fetal Heart Rate(bpm):   147  Cardiac Activity:        Observed  Presentation:            Breech  Placenta:                Left lateral  Amniotic Fluid  AFI FV:      Within normal limits  AFI Sum(cm)     %Tile       Largest Pocket(cm)  12.7            38          4.4  RUQ(cm)       RLQ(cm)       LUQ(cm)        LLQ(cm)  4.4           3.4           3.4            1.5  Comment:    Enlarged, heterogeneous Placenta, Extending from fundus to              internal os.    (placental Abruption?) and Placenta previa totalis              vs Marginalis (due to enlaregemert) ---------------------------------------------------------------------- Biometry  BPD:      76.5  mm     G. Age:  30w 5d          1  %    CI:        76.07   %    70 - 86                                                           FL/HC:       19.2  %    19.9 - 21.5  HC:        278  mm     G. Age:  30w 3d        < 1  %    HC/AC:       1.07       0.96 - 1.11  AC:      259.8  mm     G. Age:  30w 1d        < 1  %    FL/BPD:      69.8  %    71 - 87  FL:       53.4  mm     G. Age:  28w 2d        < 1  %    FL/AC:       20.6  %    20 - 24  Est. FW:    1425   gm     3 lb 2 oz    < 1  % ---------------------------------------------------------------------- OB History  Gravidity:    2  Living:       1 ---------------------------------------------------------------------- Gestational Age  Clinical EDD:  33w 3d  EDD:   10/06/21  U/S Today:     29w 6d                                        EDD:   10/31/21  Best:          33w 3d     Det. By:  Clinical EDD             EDD:   10/06/21 ---------------------------------------------------------------------- Anatomy  Bladder:               Appears normal ---------------------------------------------------------------------- Cervix Uterus Adnexa  Cervix  Length:              4  cm.  Normal appearance by transabdominal scan. ---------------------------------------------------------------------- Impression  Limited exam to assess vaginal bleeding and maternal history  of substance abuse.  Good fetal movement and amniotic fluid  Fetal growth suggest severe fetal growth restriction with an  EFW 1%.  UA Dopplers not performed as this was a bedside exam and  the sonographer was not aware of the fetal growth at the time  of the exam.  There is a large heterogenous structure at the cervix and  connected to the placenta. It is uncertain if this is a placental  abruption vs previa. ---------------------------------------------------------------------- Recommendations  UA Dopplers at first available  Repeat anatomy if not previously performed  Clinical correlation recommended. ----------------------------------------------------------------------               Heather Landsman, MD Electronically Signed Final Report   08/21/2021 09:28 pm ----------------------------------------------------------------------  [place] MFM UA CORD DOPPLER  Result Date: 08/23/2021 ----------------------------------------------------------------------  OBSTETRICS REPORT                        (Signed Final 08/23/2021 07:06 am) ---------------------------------------------------------------------- Patient Info  ID #:       [id number]                          D.O.B.:  Apr 15, 1996 (25 yrs)  Name:       [name]                   Visit Date: 08/22/2021 01:14 pm ---------------------------------------------------------------------- Performed By  Attending:        Lin Hickman      Ref. Address:      [address]                    [place]                                                              [address]  Performed By:     [name]        [facility]  Referred  By:      [name]                    CNM ---------------------------------------------------------------------- Orders  #  Description                           Code        Ordered By  1  [place] MFM UA CORD DOPPLER                76820.02    Duane Lope  2  [place] MFM OB DETAIL +14 WK               76811.01    [facility] Hickman ----------------------------------------------------------------------  #  Order #                     Accession #                Episode #  1  [id number]                   [id number]                 [id number]  2  [id number]                   [id number]                 [id number] ---------------------------------------------------------------------- Indications  Vaginal bleeding in pregnancy, third trimester  O46.93  Insufficient Prenatal Care                      O09.30  Late to prenatal care, third trimester          O09.33  Heroin/Methadone use                            O99.320 F11.10  Chronic Hepatitis C complicating pregnancy,      O98.419 B18.2  antepartum  Twin pregnancy, di/di, unspecified trimester    O28.049  (vanishing twin) twin demise  [redacted] weeks gestation of pregnancy                 Z3A.33  Maternal care for excessive fetal growth,       O36.63X0  third trimester, fetus unspecified ---------------------------------------------------------------------- Fetal Evaluation  Num Of Fetuses:          1  Fetal Heart Rate(bpm):   157  Cardiac Activity:        Observed  Presentation:            Breech  Placenta:                Left lateral,large heterogeneous  Amniotic Fluid  AFI FV:      Within normal limits  AFI Sum(cm)     %Tile       Largest Pocket(cm)  10.7            24          3.2  RUQ(cm)       RLQ(cm)       LUQ(cm)        LLQ(cm)  3             3.2           2.8            1.7  Comment:  Large placental hemorrhage  measuring :16x11.7x9 cm . No              extenstion to the internal cervical os.                    No Placenta              Previa seen today ---------------------------------------------------------------------- Biometry  BPD:      76.5  mm     G. Age:  30w 5d        < 1  %    CI:        76.07   %    70 - 86                                                          FL/HC:       19.2  %    19.4 - 21.8  HC:        278  mm     G. Age:  30w 3d        < 1  %    HC/AC:       1.07       0.96 - 1.11  AC:      259.8  mm     G. Age:  30w 1d        < 1  %    FL/BPD:      69.8  %    71 - 87  FL:       53.4  mm     G. Age:  28w 2d        < 1  %    FL/AC:       20.6  %    20 - 24  Est. FW:    1425   gm     3 lb 2 oz    < 1  % ---------------------------------------------------------------------- OB History  Gravidity:    2  Living:       1 ---------------------------------------------------------------------- Gestational Age  Clinical EDD:  33w 4d                                        EDD:   10/06/21  U/S Today:     29w 6d                                        EDD:   11/01/21  Best:          33w 4d     Det. By:  Clinical EDD              EDD:   10/06/21 ---------------------------------------------------------------------- Anatomy  Cranium:               Appears normal         Aortic Arch:            Appears normal  Cavum:                 Appears normal         Ductal Arch:  Appears normal  Ventricles:            Appears normal         Diaphragm:              Appears normal  Choroid Plexus:        Appears normal         Stomach:                Appears normal, left                                                                        sided  Cerebellum:            Appears normal         Abdomen:                Appears normal  Posterior Fossa:       Appears normal         Abdominal Wall:         Appears nml (cord                                                                        insert, abd wall)  Face:                  Appears normal         Cord Vessels:           Appears normal (3                         (orbits and profile)                           vessel cord)  Lips:                  Appears normal         Kidneys:                Appear normal  Thoracic:              Appears normal         Bladder:                Appears normal  Heart:                 Appears normal         Spine:                  Ltd views no                         (4CH, axis, and                                intracranial signs of  situs)                                         NTD  RVOT:                  Appears normal         Upper Extremities:      RT visualized. LT                                                                        limited.  LVOT:                  Not well visualized    Lower Extremities:      Visualized  Other:  Lt ulna/radius seen, Hand and humerus not clearly seen.  Fetus          appears to be female. Good breathing . ---------------------------------------------------------------------- Doppler - Fetal Vessels  Umbilical Artery    S/D    %tile      RI    %tile      PI    %tile            ADFV    RDFV     1.9       10    0.47      4.5    0.62      4.5               No      No ---------------------------------------------------------------------- Cervix Uterus Adnexa  Cervix  Length:              3  cm.  Normal appearance by transabdominal scan. No placental coverage  seen.  Uterus  No abnormality visualized. ---------------------------------------------------------------------- Impression  Limited exam to assess vaginal bleeding and maternal history  of substance abuse.  Normal anatomy is seen today with measurements again  consistent with severe fetal growth restriction <1%  Good fetal movement and amniotic fluid  Suboptimal views of the fetal anatomy are seen.  The UA Dopplers demonstrated forward flow without  evidence of AEDF or REDF.  The large heterogenous posterior structure, suggestive of  retroplacental bleed, is again seen the cervix appears >2 cm  from this structure. ---------------------------------------------------------------------- Recommendations  Continue daily NST  2x weekly UA Dopplers and BPP.  Repeat growth in 3 weeks. ----------------------------------------------------------------------               Heather Landsman, MD Electronically Signed Final Report   08/23/2021 07:06 am ----------------------------------------------------------------------    Medications:  Scheduled  cloNIDine  0.1 mg Oral BID   docusate sodium  100 mg Oral Daily   influenza vac split quadrivalent PF  0.5 mL Intramuscular Tomorrow-1000   nicotine  14 mg Transdermal QHS   NIFEdipine  30 mg Oral Daily   prenatal multivitamin  1 tablet Oral Q1200   I have reviewed the patient's current medications.  ASSESSMENT: G2P1001 [redacted]w[redacted]d Estimated Date of Delivery: 10/06/21  3rd trimester bleeding. Marginal previa with marginal sinus abruption FGR, severe Breech Heroin use 1 prenatal visit  weeks  PLAN: >BMZ 2nd dose today >Dopplers ordered  for evaluation of the FGR >Continue suboxone and nicotine >Cont in  house observation   Delivery if deterioration in fetal status or if bleeding becomes more significant   BREECH--->Caesarean delivery  [employer] 08/23/2021,8:51 AM

## 2021-08-24 ENCOUNTER — Inpatient Hospital Stay (HOSPITAL_COMMUNITY): Payer: Medicaid Other | Admitting: Anesthesiology

## 2021-08-24 ENCOUNTER — Inpatient Hospital Stay (HOSPITAL_BASED_OUTPATIENT_CLINIC_OR_DEPARTMENT_OTHER): Payer: Medicaid Other

## 2021-08-24 ENCOUNTER — Encounter (HOSPITAL_COMMUNITY)
Admission: AD | Disposition: A | Discharge status: Home / Self Care | Payer: Self-pay | Source: Home / Self Care | Attending: Obstetrics & Gynecology

## 2021-08-24 ENCOUNTER — Encounter (HOSPITAL_COMMUNITY): Payer: Self-pay | Admitting: Obstetrics and Gynecology

## 2021-08-24 DIAGNOSIS — O99323 Drug use complicating pregnancy, third trimester: Secondary | ICD-10-CM | POA: Diagnosis not present

## 2021-08-24 DIAGNOSIS — F1110 Opioid abuse, uncomplicated: Secondary | ICD-10-CM

## 2021-08-24 DIAGNOSIS — O0933 Supervision of pregnancy with insufficient antenatal care, third trimester: Secondary | ICD-10-CM

## 2021-08-24 DIAGNOSIS — Z98891 History of uterine scar from previous surgery: Secondary | ICD-10-CM

## 2021-08-24 DIAGNOSIS — Z30014 Encounter for initial prescription of intrauterine contraceptive device: Secondary | ICD-10-CM | POA: Diagnosis not present

## 2021-08-24 DIAGNOSIS — O1415 Severe pre-eclampsia, complicating the puerperium: Secondary | ICD-10-CM | POA: Diagnosis present

## 2021-08-24 DIAGNOSIS — F1910 Other psychoactive substance abuse, uncomplicated: Secondary | ICD-10-CM

## 2021-08-24 DIAGNOSIS — O4593 Premature separation of placenta, unspecified, third trimester: Secondary | ICD-10-CM

## 2021-08-24 DIAGNOSIS — Z3A33 33 weeks gestation of pregnancy: Secondary | ICD-10-CM

## 2021-08-24 DIAGNOSIS — O365930 Maternal care for other known or suspected poor fetal growth, third trimester, not applicable or unspecified: Secondary | ICD-10-CM | POA: Diagnosis not present

## 2021-08-24 DIAGNOSIS — O98413 Viral hepatitis complicating pregnancy, third trimester: Secondary | ICD-10-CM

## 2021-08-24 DIAGNOSIS — O4590 Premature separation of placenta, unspecified, unspecified trimester: Secondary | ICD-10-CM | POA: Diagnosis present

## 2021-08-24 DIAGNOSIS — O99324 Drug use complicating childbirth: Secondary | ICD-10-CM

## 2021-08-24 DIAGNOSIS — O1414 Severe pre-eclampsia complicating childbirth: Secondary | ICD-10-CM

## 2021-08-24 LAB — COMPREHENSIVE METABOLIC PANEL
ALT: 19 U/L (ref 0–44)
AST: 23 U/L (ref 15–41)
Albumin: 2.1 g/dL — ABNORMAL LOW (ref 3.5–5.0)
Alkaline Phosphatase: 102 U/L (ref 38–126)
Anion gap: 8 (ref 5–15)
BUN: 12 mg/dL (ref 6–20)
CO2: 20 mmol/L — ABNORMAL LOW (ref 22–32)
Calcium: 8.2 mg/dL — ABNORMAL LOW (ref 8.9–10.3)
Chloride: 107 mmol/L (ref 98–111)
Creatinine, Ser: 0.58 mg/dL (ref 0.44–1.00)
GFR, Estimated: >60 mL/min (ref >60)
Glucose, Bld: 91 mg/dL (ref 70–99)
Potassium: 4 mmol/L (ref 3.5–5.1)
Sodium: 135 mmol/L (ref 135–145)
Total Bilirubin: 0.3 mg/dL (ref 0.3–1.2)
Total Protein: 5.6 g/dL — ABNORMAL LOW (ref 6.5–8.1)

## 2021-08-24 LAB — TYPE AND SCREEN
ABO/RH(D): A POS
Antibody Screen: NEGATIVE

## 2021-08-24 LAB — CBC
HCT: 31.8 % — ABNORMAL LOW (ref 36.0–46.0)
Hemoglobin: 10.3 g/dL — ABNORMAL LOW (ref 12.0–15.0)
MCH: 31 pg (ref 26.0–34.0)
MCHC: 32.4 g/dL (ref 30.0–36.0)
MCV: 95.8 fL (ref 80.0–100.0)
Platelets: 223 10*3/uL (ref 150–400)
RBC: 3.32 MIL/uL — ABNORMAL LOW (ref 3.87–5.11)
RDW: 14.4 % (ref 11.5–15.5)
WBC: 13.1 10*3/uL — ABNORMAL HIGH (ref 4.0–10.5)
nRBC: 2 % — ABNORMAL HIGH (ref 0.0–0.2)

## 2021-08-24 LAB — RAPID URINE DRUG SCREEN, HOSP PERFORMED
Amphetamines: POSITIVE — AB
Barbiturates: NOT DETECTED
Benzodiazepines: NOT DETECTED
Cocaine: NOT DETECTED
Opiates: NOT DETECTED
Tetrahydrocannabinol: NOT DETECTED

## 2021-08-24 SURGERY — Surgical Case
Anesthesia: Spinal

## 2021-08-24 MED ORDER — LABETALOL HCL 5 MG/ML IV SOLN: 80.0000 mg | INTRAVENOUS | Status: DC | PRN

## 2021-08-24 MED ORDER — PHENYLEPHRINE HCL-NACL 20-0.9 MG/250ML-% IV SOLN
INTRAVENOUS | Status: AC
  Filled 2021-08-24: qty 250

## 2021-08-24 MED ORDER — IBUPROFEN 600 MG PO TABS
600.0000 mg | ORAL_TABLET | Freq: Four times a day (QID) | ORAL | Status: DC
  Administered 2021-08-25 – 2021-08-26 (×4): 600 mg via ORAL
  Filled 2021-08-24 (×4): qty 1

## 2021-08-24 MED ORDER — LABETALOL HCL 5 MG/ML IV SOLN
20.0000 mg | INTRAVENOUS | Status: DC | PRN
  Administered 2021-08-24: 20 mg via INTRAVENOUS

## 2021-08-24 MED ORDER — SODIUM CHLORIDE 0.9 % IR SOLN
Status: DC | PRN
  Administered 2021-08-24: 1

## 2021-08-24 MED ORDER — OXYTOCIN-SODIUM CHLORIDE 30-0.9 UT/500ML-% IV SOLN
INTRAVENOUS | Status: AC
  Filled 2021-08-24: qty 500

## 2021-08-24 MED ORDER — LEVONORGESTREL 20.1 MCG/DAY IU IUD
INTRAUTERINE_SYSTEM | INTRAUTERINE | Status: AC
  Filled 2021-08-24: qty 1

## 2021-08-24 MED ORDER — FENTANYL CITRATE (PF) 100 MCG/2ML IJ SOLN
INTRAMUSCULAR | Status: AC
  Filled 2021-08-24: qty 2

## 2021-08-24 MED ORDER — LABETALOL HCL 5 MG/ML IV SOLN: 40.0000 mg | INTRAVENOUS | Status: DC | PRN

## 2021-08-24 MED ORDER — KETOROLAC TROMETHAMINE 30 MG/ML IJ SOLN
INTRAMUSCULAR | Status: AC
  Filled 2021-08-24: qty 1

## 2021-08-24 MED ORDER — MEASLES, MUMPS & RUBELLA VAC IJ SOLR: 0.5000 mL | Freq: Once | INTRAMUSCULAR | Status: DC

## 2021-08-24 MED ORDER — SCOPOLAMINE 1 MG/3DAYS TD PT72
MEDICATED_PATCH | TRANSDERMAL | Status: DC | PRN
  Administered 2021-08-24: 1 via TRANSDERMAL

## 2021-08-24 MED ORDER — PHENYLEPHRINE HCL-NACL 20-0.9 MG/250ML-% IV SOLN
INTRAVENOUS | Status: DC | PRN
  Administered 2021-08-24: 60 ug/min via INTRAVENOUS

## 2021-08-24 MED ORDER — LACTATED RINGERS IV SOLN: INTRAVENOUS | Status: DC

## 2021-08-24 MED ORDER — TETANUS-DIPHTH-ACELL PERTUSSIS 5-2.5-18.5 LF-MCG/0.5 IM SUSY: 0.5000 mL | PREFILLED_SYRINGE | Freq: Once | INTRAMUSCULAR | Status: DC

## 2021-08-24 MED ORDER — SIMETHICONE 80 MG PO CHEW: 80.0000 mg | CHEWABLE_TABLET | ORAL | Status: DC | PRN

## 2021-08-24 MED ORDER — WITCH HAZEL-GLYCERIN EX PADS: 1.0000 "application " | MEDICATED_PAD | CUTANEOUS | Status: DC | PRN

## 2021-08-24 MED ORDER — METHADONE HCL 10 MG PO TABS
30.0000 mg | ORAL_TABLET | Freq: Every day | ORAL | Status: DC
  Administered 2021-08-24 – 2021-08-26 (×3): 30 mg via ORAL
  Filled 2021-08-24 (×2): qty 3

## 2021-08-24 MED ORDER — DIPHENHYDRAMINE HCL 25 MG PO CAPS: 25.0000 mg | ORAL_CAPSULE | Freq: Four times a day (QID) | ORAL | Status: DC | PRN

## 2021-08-24 MED ORDER — HYDRALAZINE HCL 20 MG/ML IJ SOLN: 10.0000 mg | INTRAMUSCULAR | Status: DC | PRN

## 2021-08-24 MED ORDER — LEVONORGESTREL 20.1 MCG/DAY IU IUD
1.0000 | INTRAUTERINE_SYSTEM | Freq: Once | INTRAUTERINE | Status: AC
  Administered 2021-08-24: 1 via INTRAUTERINE
  Filled 2021-08-24: qty 1

## 2021-08-24 MED ORDER — FENTANYL CITRATE (PF) 100 MCG/2ML IJ SOLN
25.0000 ug | INTRAMUSCULAR | Status: DC | PRN
  Administered 2021-08-24 (×4): 50 ug via INTRAVENOUS

## 2021-08-24 MED ORDER — METHADONE HCL 10 MG PO TABS
30.0000 mg | ORAL_TABLET | Freq: Every day | ORAL | Status: DC
  Filled 2021-08-24: qty 3

## 2021-08-24 MED ORDER — DIPHENHYDRAMINE HCL 25 MG PO CAPS: 25.0000 mg | ORAL_CAPSULE | ORAL | Status: DC | PRN

## 2021-08-24 MED ORDER — CEFAZOLIN SODIUM-DEXTROSE 2-3 GM-%(50ML) IV SOLR
INTRAVENOUS | Status: DC | PRN
  Administered 2021-08-24: 2 g via INTRAVENOUS

## 2021-08-24 MED ORDER — KETOROLAC TROMETHAMINE 30 MG/ML IJ SOLN
30.0000 mg | Freq: Four times a day (QID) | INTRAMUSCULAR | Status: DC | PRN
  Administered 2021-08-24: 30 mg via INTRAVENOUS

## 2021-08-24 MED ORDER — NALOXONE HCL 4 MG/10ML IJ SOLN
1.0000 ug/kg/h | INTRAVENOUS | Status: DC | PRN
  Filled 2021-08-24: qty 5

## 2021-08-24 MED ORDER — SCOPOLAMINE 1 MG/3DAYS TD PT72
MEDICATED_PATCH | TRANSDERMAL | Status: AC
  Filled 2021-08-24: qty 1

## 2021-08-24 MED ORDER — MENTHOL 3 MG MT LOZG: 1.0000 | LOZENGE | OROMUCOSAL | Status: DC | PRN

## 2021-08-24 MED ORDER — ONDANSETRON HCL 4 MG/2ML IJ SOLN
INTRAMUSCULAR | Status: AC
  Filled 2021-08-24: qty 2

## 2021-08-24 MED ORDER — METHYLERGONOVINE MALEATE 0.2 MG/ML IJ SOLN
INTRAMUSCULAR | Status: DC | PRN
  Administered 2021-08-24: .2 mg via INTRAMUSCULAR

## 2021-08-24 MED ORDER — DIBUCAINE (PERIANAL) 1 % EX OINT: 1.0000 "application " | TOPICAL_OINTMENT | CUTANEOUS | Status: DC | PRN

## 2021-08-24 MED ORDER — STERILE WATER FOR IRRIGATION IR SOLN
Status: DC | PRN
  Administered 2021-08-24: 1000 mL

## 2021-08-24 MED ORDER — MEPERIDINE HCL 25 MG/ML IJ SOLN: 6.2500 mg | INTRAMUSCULAR | Status: DC | PRN

## 2021-08-24 MED ORDER — TRANEXAMIC ACID-NACL 1000-0.7 MG/100ML-% IV SOLN
INTRAVENOUS | Status: AC
  Filled 2021-08-24: qty 100

## 2021-08-24 MED ORDER — ONDANSETRON HCL 4 MG/2ML IJ SOLN
INTRAMUSCULAR | Status: DC | PRN
  Administered 2021-08-24 (×2): 4 mg via INTRAVENOUS

## 2021-08-24 MED ORDER — SCOPOLAMINE 1 MG/3DAYS TD PT72: 1.0000 | MEDICATED_PATCH | TRANSDERMAL | Status: DC

## 2021-08-24 MED ORDER — ENOXAPARIN SODIUM 40 MG/0.4ML IJ SOSY
40.0000 mg | PREFILLED_SYRINGE | INTRAMUSCULAR | Status: DC
  Administered 2021-08-25: 08:00:00 40 mg via SUBCUTANEOUS
  Filled 2021-08-24 (×2): qty 0.4

## 2021-08-24 MED ORDER — SOD CITRATE-CITRIC ACID 500-334 MG/5ML PO SOLN: 30.0000 mL | Freq: Once | ORAL | Status: DC

## 2021-08-24 MED ORDER — ONDANSETRON HCL 4 MG/2ML IJ SOLN: 4.0000 mg | Freq: Three times a day (TID) | INTRAMUSCULAR | Status: DC | PRN

## 2021-08-24 MED ORDER — SODIUM CHLORIDE 0.9% FLUSH: 3.0000 mL | INTRAVENOUS | Status: DC | PRN

## 2021-08-24 MED ORDER — MORPHINE SULFATE (PF) 0.5 MG/ML IJ SOLN
INTRAMUSCULAR | Status: AC
  Filled 2021-08-24: qty 10

## 2021-08-24 MED ORDER — COCONUT OIL OIL: 1.0000 "application " | TOPICAL_OIL | Status: DC | PRN

## 2021-08-24 MED ORDER — FERROUS SULFATE 325 (65 FE) MG PO TABS
325.0000 mg | ORAL_TABLET | ORAL | Status: DC
  Administered 2021-08-25: 10:00:00 325 mg via ORAL
  Filled 2021-08-24: qty 1

## 2021-08-24 MED ORDER — BUPIVACAINE IN DEXTROSE 0.75-8.25 % IT SOLN
INTRATHECAL | Status: DC | PRN
  Administered 2021-08-24: 1.5 mL via INTRATHECAL

## 2021-08-24 MED ORDER — PRENATAL MULTIVITAMIN CH
1.0000 | ORAL_TABLET | Freq: Every day | ORAL | Status: DC
  Administered 2021-08-25: 12:00:00 1 via ORAL
  Filled 2021-08-24: qty 1

## 2021-08-24 MED ORDER — MORPHINE SULFATE (PF) 0.5 MG/ML IJ SOLN
INTRAMUSCULAR | Status: DC | PRN
  Administered 2021-08-24: .15 mg via INTRATHECAL

## 2021-08-24 MED ORDER — FENTANYL CITRATE (PF) 100 MCG/2ML IJ SOLN
INTRAMUSCULAR | Status: DC | PRN
  Administered 2021-08-24: 15 ug via INTRATHECAL

## 2021-08-24 MED ORDER — KETOROLAC TROMETHAMINE 30 MG/ML IJ SOLN: 30.0000 mg | Freq: Four times a day (QID) | INTRAMUSCULAR | Status: DC | PRN

## 2021-08-24 MED ORDER — MAGNESIUM SULFATE BOLUS VIA INFUSION
4.0000 g | Freq: Once | INTRAVENOUS | Status: AC
  Administered 2021-08-24: 4 g via INTRAVENOUS
  Filled 2021-08-24: qty 1000

## 2021-08-24 MED ORDER — OXYTOCIN-SODIUM CHLORIDE 30-0.9 UT/500ML-% IV SOLN: INTRAVENOUS | Status: DC | PRN

## 2021-08-24 MED ORDER — NALOXONE HCL 0.4 MG/ML IJ SOLN: 0.4000 mg | INTRAMUSCULAR | Status: DC | PRN

## 2021-08-24 MED ORDER — MAGNESIUM HYDROXIDE 400 MG/5ML PO SUSP: 30.0000 mL | ORAL | Status: DC | PRN

## 2021-08-24 MED ORDER — GABAPENTIN 300 MG PO CAPS
300.0000 mg | ORAL_CAPSULE | Freq: Two times a day (BID) | ORAL | Status: DC
  Administered 2021-08-24 – 2021-08-26 (×5): 300 mg via ORAL
  Filled 2021-08-24 (×5): qty 1

## 2021-08-24 MED ORDER — TRANEXAMIC ACID-NACL 1000-0.7 MG/100ML-% IV SOLN
INTRAVENOUS | Status: DC | PRN
  Administered 2021-08-24: 1000 mg via INTRAVENOUS

## 2021-08-24 MED ORDER — DIPHENHYDRAMINE HCL 50 MG/ML IJ SOLN: 12.5000 mg | INTRAMUSCULAR | Status: DC | PRN

## 2021-08-24 MED ORDER — MAGNESIUM SULFATE 40 GM/1000ML IV SOLN
INTRAVENOUS | Status: AC
  Filled 2021-08-24: qty 1000

## 2021-08-24 MED ORDER — MAGNESIUM SULFATE 40 GM/1000ML IV SOLN
2.0000 g/h | INTRAVENOUS | Status: AC
  Administered 2021-08-24 – 2021-08-25 (×2): 2 g/h via INTRAVENOUS
  Filled 2021-08-24: qty 1000

## 2021-08-24 MED ORDER — OXYTOCIN-SODIUM CHLORIDE 30-0.9 UT/500ML-% IV SOLN
2.5000 [IU]/h | INTRAVENOUS | Status: AC
  Administered 2021-08-24: 2.5 [IU]/h via INTRAVENOUS

## 2021-08-24 MED ORDER — SOD CITRATE-CITRIC ACID 500-334 MG/5ML PO SOLN
ORAL | Status: AC
  Administered 2021-08-24: 30 mL
  Filled 2021-08-24: qty 30

## 2021-08-24 MED ORDER — OXYCODONE HCL 5 MG PO TABS
5.0000 mg | ORAL_TABLET | ORAL | Status: DC | PRN
  Administered 2021-08-25 (×2): 10 mg via ORAL
  Filled 2021-08-24 (×2): qty 2

## 2021-08-24 MED ORDER — OXYCODONE-ACETAMINOPHEN 5-325 MG PO TABS
2.0000 | ORAL_TABLET | ORAL | Status: DC | PRN
  Administered 2021-08-24 – 2021-08-26 (×2): 2 via ORAL
  Filled 2021-08-24 (×2): qty 2

## 2021-08-24 MED ORDER — SENNOSIDES-DOCUSATE SODIUM 8.6-50 MG PO TABS
2.0000 | ORAL_TABLET | Freq: Every day | ORAL | Status: DC
  Administered 2021-08-25 – 2021-08-26 (×2): 2 via ORAL
  Filled 2021-08-24 (×2): qty 2

## 2021-08-24 MED ORDER — DEXAMETHASONE SODIUM PHOSPHATE 4 MG/ML IJ SOLN
INTRAMUSCULAR | Status: DC | PRN
  Administered 2021-08-24: 4 mg via INTRAVENOUS

## 2021-08-24 MED ORDER — ACETAMINOPHEN 500 MG PO TABS
1000.0000 mg | ORAL_TABLET | Freq: Four times a day (QID) | ORAL | Status: DC
  Administered 2021-08-24 – 2021-08-26 (×6): 1000 mg via ORAL
  Filled 2021-08-24 (×6): qty 2

## 2021-08-24 MED ORDER — HYDROMORPHONE HCL 1 MG/ML IJ SOLN
1.0000 mg | INTRAMUSCULAR | Status: DC | PRN
  Administered 2021-08-24: 1 mg via INTRAVENOUS
  Filled 2021-08-24: qty 1

## 2021-08-24 MED ORDER — LABETALOL HCL 5 MG/ML IV SOLN
INTRAVENOUS | Status: AC
  Filled 2021-08-24: qty 4

## 2021-08-24 MED ORDER — OXYTOCIN-SODIUM CHLORIDE 30-0.9 UT/500ML-% IV SOLN
INTRAVENOUS | Status: DC | PRN
  Administered 2021-08-24: 500 mL via INTRAVENOUS

## 2021-08-24 MED ORDER — KETOROLAC TROMETHAMINE 30 MG/ML IJ SOLN
30.0000 mg | Freq: Four times a day (QID) | INTRAMUSCULAR | Status: AC
  Administered 2021-08-24 – 2021-08-25 (×3): 30 mg via INTRAVENOUS
  Filled 2021-08-24 (×3): qty 1

## 2021-08-24 MED ORDER — ZOLPIDEM TARTRATE 5 MG PO TABS: 5.0000 mg | ORAL_TABLET | Freq: Every evening | ORAL | Status: DC | PRN

## 2021-08-24 SURGICAL SUPPLY — 32 items
CHLORAPREP W/TINT 26ML (MISCELLANEOUS) ×2 IMPLANT
CLAMP CORD UMBIL (MISCELLANEOUS) IMPLANT
CLIP FILSHIE TUBAL LIGA STRL (Clip) IMPLANT
CLOTH BEACON ORANGE TIMEOUT ST (SAFETY) ×2 IMPLANT
DRSG OPSITE POSTOP 4X10 (GAUZE/BANDAGES/DRESSINGS) ×2 IMPLANT
ELECT REM PT RETURN 9FT ADLT (ELECTROSURGICAL) ×2
ELECTRODE REM PT RTRN 9FT ADLT (ELECTROSURGICAL) ×1 IMPLANT
EXTRACTOR VACUUM M CUP 4 TUBE (SUCTIONS) IMPLANT
GAUZE SPONGE 4X4 12PLY STRL LF (GAUZE/BANDAGES/DRESSINGS) ×1 IMPLANT
GLOVE BIOGEL PI IND STRL 7.0 (GLOVE) ×3 IMPLANT
GLOVE BIOGEL PI INDICATOR 7.0 (GLOVE) ×3
GLOVE ECLIPSE 7.0 STRL STRAW (GLOVE) ×2 IMPLANT
GOWN STRL REUS W/TWL LRG LVL3 (GOWN DISPOSABLE) ×4 IMPLANT
KIT ABG SYR 3ML LUER SLIP (SYRINGE) IMPLANT
NDL HYPO 25X5/8 SAFETYGLIDE (NEEDLE) ×1 IMPLANT
NEEDLE HYPO 22GX1.5 SAFETY (NEEDLE) ×2 IMPLANT
NEEDLE HYPO 25X5/8 SAFETYGLIDE (NEEDLE) ×2 IMPLANT
NS IRRIG 1000ML POUR BTL (IV SOLUTION) ×2 IMPLANT
PACK C SECTION WH (CUSTOM PROCEDURE TRAY) ×2 IMPLANT
PAD ABD 7.5X8 STRL (GAUZE/BANDAGES/DRESSINGS) ×2 IMPLANT
PAD OB MATERNITY 4.3X12.25 (PERSONAL CARE ITEMS) ×2 IMPLANT
PENCIL SMOKE EVAC W/HOLSTER (ELECTROSURGICAL) ×2 IMPLANT
RTRCTR C-SECT PINK 25CM LRG (MISCELLANEOUS) IMPLANT
SUT PDS AB 0 CTX 36 PDP370T (SUTURE) IMPLANT
SUT PLAIN 2 0 XLH (SUTURE) IMPLANT
SUT VIC AB 0 CTX 36 (SUTURE) ×4
SUT VIC AB 0 CTX36XBRD ANBCTRL (SUTURE) ×2 IMPLANT
SUT VIC AB 4-0 KS 27 (SUTURE) ×2 IMPLANT
SYR CONTROL 10ML LL (SYRINGE) ×2 IMPLANT
TOWEL OR 17X24 6PK STRL BLUE (TOWEL DISPOSABLE) ×2 IMPLANT
TRAY FOLEY W/BAG SLVR 14FR LF (SET/KITS/TRAYS/PACK) ×2 IMPLANT
WATER STERILE IRR 1000ML POUR (IV SOLUTION) ×2 IMPLANT

## 2021-08-24 NOTE — Discharge Summary (Addendum)
Postpartum Discharge Summary     Patient Name: [name] DOB: 01-19-1996 MRN: [id number]  Date of admission: 08/21/2021 Delivery date:08/24/2021  Delivering provider: Verita Schneiders A  Date of discharge: 08/26/2021  Admitting diagnosis: Vaginal bleeding during pregnancy [O46.90] Intrauterine pregnancy: [redacted]w[redacted]d    Secondary diagnosis:  Principal Problem:   S/P cesarean section Active Problems:   Illicit drug use   MDD (major depressive disorder), recurrent episode, severe ([place]   Su[place] attempt by substance overdose ([place]   Vanishing twin syndrome   Supervision of high risk pregnancy, antepartum   Opioid use disorder, moderate, dependence ([place]   Vaginal bleeding during pregnancy   Hepatitis C infection   [place]ad[place] maintenance treatment affecting [place] ([place]   Placental abruption, delivered   Severe preeclampsia, delivered/postpartum   Anemia, postpartum  Additional problems: None    Discharge diagnosis: Term Pregnancy Delivered and Severe preeclampsia, delivered                                               Post partum procedures: None Augmentation: N/A Complications: [facility] 25[facility] [id number] [facility] with known marginal previa, heroin/opioid use disorder, severe fetal growth restriction on 08/21/2021. She was having a scheduled BPP on 12/13 and found to have a BPP 2/10 and the decision was made to deliver via an urgent cesarean section for non reassuring fetal testing. Delivery details as follows: Membrane Rupture Time/Date: 9:47 AM ,08/24/2021   Delivery Method:C-Section, Low Transverse  Details of operation can be found in separate operative note, she had IUD placed during cesarean section.  Patient's postpartum course was significant for continuing methadone regimen.  She will call [facility] and set up outpatient Methadone treatment.  Patient received intrapartum and postpartum  magnesium sulfate for eclampsia prophylaxis as per protocol. BP control was obtained with Procardia XL and Lasix, there were no further immediate complications.  Otherwise, patient had a routine postpartum course. She is ambulating, tolerating a regular diet, passing flatus, and urinating well. Patient is discharged home in stable condition on 08/26/2021, and will follow up in the office for BP check in one week..  Newborn Data: Birth date:08/24/2021  Birth time:9:47 AM  Gender:Female  Living status:Living  Apgars:7 ,9  Weight:1340 g   Magnesium Sulfate received: Yes: Seizure prophylaxis BMZ received: Yes Rhophylac:No MMR:N/A  Physical exam  Vitals:   08/25/21 1941 08/25/21 2356 08/26/21 0455 08/26/21 0800  BP: 132/85 127/75 130/90 124/81  Pulse: 97 90 74 80  Resp: _0 Temp: 98.1 F (36.7 C) 98.5 F (36.9 C) 98.5 F (36.9 C) 98.3 F (36.8 C)  TempSrc: Oral Oral Oral Oral  SpO2: 100% 100% 99% 99%  Weight:      Height:       General: alert, cooperative, and no distress Lochia: appropriate Uterine Fundus: firm Incision: Healing well with no significant drainage, No significant erythema, Dressing is clean, dry, and intact DVT Evaluation: No evidence of DVT seen on physical exam. Negative Homan's sign. No cords or calf tenderness. No significant calf/ankle edema. Labs: Lab Results  Component Value Date   WBC 24.3 (H) 08/25/2021   HGB 9.6 (L) 08/25/2021   HCT 28.6 (L) 08/25/2021   MCV 93.5 08/25/2021   PLT 247 08/25/2021   CMP Latest Ref  Units 08/25/2021  °Glucose 70 - 99 mg/dL 94  °BUN 6 - 20 mg/dL 9  °Creatinine 0.44 - 1.00 mg/dL 0.70  °Sodium 135 - 145 mmol/L 133(L)  °Potassium 3.5 - 5.1 mmol/L 3.9  °Chloride 98 - 111 mmol/L 104  °CO2 22 - 32 mmol/L 23  °Calcium 8.9 - 10.3 mg/dL 6.0(LL)  °Total Protein 6.5 - 8.1 g/dL 5.2(L)  °Total Bilirubin 0.3 - 1.2 mg/dL 0.2(L)  °Alkaline Phos 38 - 126 U/L 97  °AST 15 - 41 U/L 21  °ALT 0 - 44 U/L 19  ° °Edinburgh  Score: °Edinburgh Postnatal Depression Scale Screening Tool 08/25/2021  °I have been able to laugh and see the funny side of things. 1  °I have looked forward with enjoyment to things. 1  °I have blamed myself unnecessarily when things went wrong. 2  °I have been anxious or worried for no good reason. 3  °I have felt scared or panicky for no good reason. 2  °Things have been getting on top of me. 1  °I have been so unhappy that I have had difficulty sleeping. 2  °I have felt sad or miserable. 2  °I have been so unhappy that I have been crying. 3  °The thought of harming myself has occurred to me. 0  °Edinburgh Postnatal Depression Scale Total 17  ° ° ° °After visit meds:  °Allergies as of 08/26/2021   °No Known Allergies °  ° °  °Medication List  °  ° °STOP taking these medications   ° °PNV Prenatal Plus Multivitamin 27-1 MG Tabs °  ° °  ° °TAKE these medications   ° °busPIRone 10 MG tablet °Commonly known as: BUSPAR °Take 1 tablet (10 mg total) by mouth 3 (three) times daily. °  °furosemide 20 MG tablet °Commonly known as: Lasix °Take 1 tablet (20 mg total) by mouth 2 (two) times daily for 5 days. °  °ibuprofen 600 MG tablet °Commonly known as: ADVIL °Take 1 tablet (600 mg total) by mouth every 6 (six) hours. °  °NIFEdipine 30 MG 24 hr tablet °Commonly known as: ADALAT CC °Take 1 tablet (30 mg total) by mouth daily. °Start taking on: August 27, 2021 °  °oxyCODONE 5 MG immediate release tablet °Commonly known as: Oxy IR/ROXICODONE °Take 1 tablet (5 mg total) by mouth every 4 (four) hours as needed for severe pain. °  °senna-docusate 8.6-50 MG tablet °Commonly known as: Senokot-S °Take 2 tablets by mouth at bedtime as needed for mild constipation or moderate constipation. °  ° °  ° ° ° °Discharge home in stable condition °Infant Feeding: Bottle °Infant Disposition:NICU °Discharge instruction: per After Visit Summary and Postpartum booklet. °Activity: Advance as tolerated. Pelvic rest for 6 weeks.  °Diet: routine  diet °Future Appointments: °Future Appointments  °Date Time Provider Department Center  °08/31/2021 11:10 AM Eure, Luther H, MD [facility]  °09/28/2021 11:10 AM Booker, Kimberly R, CNM [facility]  ° °Follow up Visit: °Message sent to [facility] by Dr. Das on 12/13 ° °Please schedule this patient for a In person postpartum visit in 4 weeks with the following provider: MD. °Additional Postpartum F/U:Incision check and BP check 1 week  °High risk pregnancy complicated by:  abruption,  °Delivery mode:  C-Section, Low Transverse  °Anticipated Birth Control:  PP IUD placed ° ° °08/26/2021 °Ugonna Anyanwu, MD ° ° ° °

## 2021-08-24 NOTE — Op Note (Deleted)
[name] PROCEDURE DATE: 08/24/2021  PREOPERATIVE DIAGNOSES: Intrauterine pregnancy at [redacted]w[redacted]d weeks gestation; vaginal bleeding in the third trimester concerning for marginal previa or abruption; severe fetal growth restriction (<1%); chronic hypertension; opioid use disorder; fetal breech presentation; desire for long term reversible contraception  POSTOPERATIVE DIAGNOSES: The same  PROCEDURE: Low Transverse Cesarean Section and Intrauterine Device Placement  SURGEON:  Dr. Jaynie Collins  ASSISTANT:  Dr. Warner Mccreedy (OB Fellow)  ANESTHESIOLOGY TEAM: Anesthesiologist: Mal Amabile, MD CRNA: Rhymer, Doree Fudge, CRNA  INDICATIONS: [name] is a 25 y.o. [id number] at [redacted]w[redacted]d here for cesarean section and intrauterine device placement secondary to the indications listed under preoperative diagnoses; please see preoperative notes for further details.  The risks of surgery were discussed with the patient including but were not limited to: bleeding which may require transfusion or reoperation; infection which may require antibiotics; injury to bowel, bladder, ureters or other surrounding organs; injury to the fetus; need for additional procedures including hysterectomy in the event of a life-threatening hemorrhage; formation of adhesions; placental abnormalities wth subsequent pregnancies; incisional problems; thromboembolic phenomenon and other postoperative/anesthesia complications.   For the postplacental IUD placement, discussed risks of postpartum expulsion, irregular bleeding, cramping, infection, malpositioning or misplacement of the IUD which may require further procedures. Also discussed >99% contraception efficacy, increased risk of ectopic pregnancy with failure of method.   The patient concurred with the proposed plan, giving informed written consent for the procedures.    FINDINGS:  Viable female infant in breech presentation.  Apgars 7 and 9.  Nuchal cord x 2, body cord x 1. Heavy  meconium in amniotic fluid. Old placental abruption noted.  Intact placenta, two vessel cord noted.  Normal uterus, fallopian tubes and ovaries bilaterally. Liletta IUD placed after placental delivery.   ANESTHESIA: Spinal INTRAVENOUS FLUIDS: 1000 ml   ESTIMATED BLOOD LOSS: 605 ml URINE OUTPUT:  75 ml SPECIMENS: Placenta sent to pathology COMPLICATIONS: None immediate  PROCEDURE IN DETAIL:  The patient preoperatively received intravenous antibiotics and had sequential compression devices applied to her lower extremities.  She was then taken to the operating room where spinal anesthesia was administered and was found to be adequate. She was then placed in a dorsal supine position with a leftward tilt, and prepped and draped in a sterile manner.  A foley catheter was placed into her bladder and attached to constant gravity.  After an adequate timeout was performed, a Pfannenstiel skin incision was made with scalpel and carried through to the underlying layer of fascia. The fascia was incised in the midline, and this incision was extended bilaterally in a blunt fashion.  The fascia was dissected off the underlying rectus muscles bluntly superiorly and inferiorly.  The rectus muscles were separated in the midline bluntly and the peritoneum was entered bluntly. The Alexis self-retaining retractor was introduced into the abdominal cavity.  Attention was turned to the lower uterine segment where a low transverse hysterotomy was made with a scalpel and extended bilaterally bluntly.  The infant was successfully delivered, the cord was clamped and cut, and the infant was handed over to the awaiting neonatology team. Uterine massage was then administered, and the placenta delivered intact with a three-vessel cord. The uterus was then cleared of clots and debris.  The Liletta IUD was placed in the fundal region, and the strings were pushed through the lower uterine segment into cervix and upper vagina.  The hysterotomy  was closed with 0 Vicryl in a running locked fashion, and an  imbricating layer was also placed with 0 Vicryl.  Figure-of-eight 0 Vicryl serosal stitches were placed to help with hemostasis.  The pelvis was cleared of all clot and debris. Hemostasis was confirmed on all surfaces.  The retractor was removed.  The peritoneum was closed with a 0 Vicryl running stitch and the rectus muscles were reapproximated using 0 Vicryl interrupted stitches. The fascia was then closed using 0 Vicryl in a running fashion.  The subcutaneous layer was irrigated, reapproximated with 2-0 plain gut interrupted stitches, and the skin was closed with a 4-0 Vicryl subcuticular stitch. The patient tolerated the procedure well. Sponge, instrument and needle counts were correct x 3.  She was taken to the recovery room in stable condition.    Jaynie Collins, MD, FACOG Obstetrician & Gynecologist, [facility] for [employer], [facility]

## 2021-08-24 NOTE — Op Note (Addendum)
[name] PROCEDURE DATE: 08/24/2021  PREOPERATIVE DIAGNOSES: Intrauterine pregnancy at [redacted]w[redacted]d weeks gestation; nonreassuring fetal status with BPP 2/10; vaginal bleeding in the third trimester concerning for marginal previa or abruption; severe fetal growth restriction (<1%); preeclampsia; opioid use disorder; fetal breech presentation; desire for long term reversible contraception  POSTOPERATIVE DIAGNOSES: The same; placental abruption  PROCEDURE: Low Transverse Cesarean Section and Intrauterine Device Placement  SURGEON:  Dr. Jaynie Collins  ASSISTANT:  Dr. Warner Mccreedy (OB Fellow)  ANESTHESIOLOGY TEAM: Anesthesiologist: Mal Amabile, MD CRNA: Rhymer, Doree Fudge, CRNA  INDICATIONS: [name] is a 25 y.o. [phone] at [redacted]w[redacted]d here for cesarean section and intrauterine device placement secondary to the indications listed under preoperative diagnoses; please see preoperative notes for further details.  The risks of surgery were discussed with the patient including but were not limited to: bleeding which may require transfusion or reoperation; infection which may require antibiotics; injury to bowel, bladder, ureters or other surrounding organs; injury to the fetus; need for additional procedures including hysterectomy in the event of a life-threatening hemorrhage; formation of adhesions; placental abnormalities wth subsequent pregnancies; incisional problems; thromboembolic phenomenon and other postoperative/anesthesia complications.   For the postplacental IUD placement, discussed risks of postpartum expulsion, irregular bleeding, cramping, infection, malpositioning or misplacement of the IUD which may require further procedures. Also discussed >99% contraception efficacy, increased risk of ectopic pregnancy with failure of method.   The patient concurred with the proposed plan, giving informed written consent for the procedures.    FINDINGS:  Viable female infant in breech presentation.  Apgars  7 and 9. Arterial cord pH 7.254, pCO2 60.8. Tight nuchal cord x 2, body cord x 1. Heavy meconium and blood stained amniotic fluid.  Large placental abruption noted.  Intact placenta, two vessel cord noted.  Normal uterus, fallopian tubes and ovaries bilaterally. Liletta IUD placed after placental delivery.   ANESTHESIA: Spinal INTRAVENOUS FLUIDS: 1000 ml   ESTIMATED BLOOD LOSS: 605 ml URINE OUTPUT:  75 ml SPECIMENS: Placenta sent to pathology COMPLICATIONS: None immediate  PROCEDURE IN DETAIL:  The patient preoperatively received intravenous antibiotics and had sequential compression devices applied to her lower extremities.  She was then taken to the operating room where spinal anesthesia was administered and was found to be adequate. She was then placed in a dorsal supine position with a leftward tilt, and prepped and draped in a sterile manner.  A foley catheter was placed into her bladder and attached to constant gravity.  After an adequate timeout was performed, a Pfannenstiel skin incision was made with scalpel and carried through to the underlying layer of fascia. The fascia was incised in the midline, and this incision was extended bilaterally in a blunt fashion.  The fascia was dissected off the underlying rectus muscles bluntly superiorly and inferiorly.  The rectus muscles were separated in the midline bluntly and the peritoneum was entered bluntly. The Alexis self-retaining retractor was introduced into the abdominal cavity.  Attention was turned to the lower uterine segment where a low transverse hysterotomy was made with a scalpel and extended bilaterally bluntly.  The infant was successfully delivered, the cord was clamped and cut, and the infant was handed over to the awaiting neonatology team. Uterine massage was then administered, and the placenta delivered intact with a three-vessel cord. The uterus was then cleared of clots and debris.  The Liletta IUD was placed in the fundal region,  and the strings were pushed through the lower uterine segment into cervix and upper  vagina.  The hysterotomy was closed with 0 Vicryl in a running locked fashion, and an imbricating layer was also placed with 0 Vicryl.  Two figure-of-eight 0 Vicryl serosal stitches were placed to help with hemostasis.  The pelvis was cleared of all clot and debris. Hemostasis was confirmed on all surfaces.  The retractor was removed.  The peritoneum was closed with a 0 Vicryl running stitch and the rectus muscles were reapproximated using 0 Vicryl interrupted stitches. The fascia was then closed using 0 Vicryl in a running fashion.  The subcutaneous layer was irrigated, reapproximated with 2-0 plain gut interrupted stitches, and the skin was closed with a 4-0 Vicryl subcuticular stitch. The patient tolerated the procedure well. Sponge, instrument and needle counts were correct x 3.  She was taken to the recovery room in stable condition.    Jaynie Collins, MD, FACOG Obstetrician & Gynecologist, [facility] for [employer], [facility]

## 2021-08-24 NOTE — Progress Notes (Addendum)
    Faculty Practice OB/GYN Attending Note  Subjective:  Called to evaluate patient with BPP 2/8 now on MFM scan. Patient is back in room, denies any contractions, LOF or vaginal bleeding. Good FM.   Admitted on 08/21/2021 for Vaginal bleeding during pregnancy.    Objective:  Blood pressure (!) 157/88, pulse 60, temperature 98.2 F (36.8 C), temperature source Oral, resp. rate 18, height 5\' 1"  (1.549 m), weight 69.3 kg, last menstrual period 12/30/2020, SpO2 100 %. FHT  Baseline 150 bpm, minimal variability,  no accelerations, no decelerations Toco: none Gen: NAD Lungs: Normal respiratory effort Heart: Regular rate noted Abdomen: NT gravid fundus, soft Cervix: Deferred Ext: 2+ DTRs, no edema, no cyanosis, negative Homan's sign  CBC Latest Ref Rng & Units 08/24/2021 08/21/2021 03/26/2021  WBC 4.0 - 10.5 K/uL 13.1(H) 12.8(H) 7.9  Hemoglobin 12.0 - 15.0 g/dL 10.3(L) 11.7(L) 14.0  Hematocrit 36.0 - 46.0 % 31.8(L) 34.8(L) 40.8  Platelets 150 - 400 K/uL 223 236 240    Assessment & Plan:  25 y.o. G2P1001 at [redacted]w[redacted]d admitted for vaginal bleeding, found to have marginal previa with abruption. Known preeclampsia without severe features, severe FGR (<1%), normal dopplers recently, breech presentation. History of Opioid use disorder (Heroin, Suboxone > Methadone started today).    Findings discussed with Drs. [redacted]w[redacted]d (MFM), will proceed with delivery.  Cesarean section recommended, patient also desires IUD for contraception.  The risks of surgery were discussed with the patient including but were not limited to: bleeding which may require transfusion or reoperation; infection which may require antibiotics; injury to bowel, bladder, ureters or other surrounding organs; injury to the fetus; need for additional procedures including hysterectomy in the event of a life-threatening hemorrhage; formation of adhesions; placental abnormalities wth subsequent pregnancies; incisional problems;  thromboembolic phenomenon and other postoperative/anesthesia complications.   For the postplacental IUD placement, discussed risks of postpartum expulsion, irregular bleeding, cramping, infection, malpositioning or misplacement of the IUD which may require further procedures. Also discussed >99% contraception efficacy, increased risk of ectopic pregnancy with failure of method.   The patient concurred with the proposed plan, giving informed written consent for the procedures.  OB OR team notified of need for urgent procedure.   NPO since 2300. OB Anesthesiologist and Neonatologist aware. To OR when ready.   Claiborne Billings, MD, FACOG Obstetrician & Gynecologist, [facility] for [facility]

## 2021-08-24 NOTE — Anesthesia Preprocedure Evaluation (Addendum)
Anesthesia Evaluation  Patient identified by MRN, date of birth, ID band Patient awake    Reviewed: Allergy & Precautions, NPO status , Patient's Chart, lab work & pertinent test results  Airway Mallampati: II  TM Distance: >3 FB Neck ROM: Full    Dental no notable dental hx. 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  /day -last dose yesterday   Endo/Other  negative endocrine ROS  Renal/GU negative Renal ROS  negative genitourinary   Musculoskeletal negative musculoskeletal ROS (+) narcotic dependent  Abdominal   Peds  Hematology  (+) anemia ,   Anesthesia Other Findings   Reproductive/Obstetrics (+) Pregnancy 33 6/7 weeks Severe IUGR BPP 2/8 Pre Eclampsia                          Anesthesia Physical Anesthesia Plan  ASA: 2 and emergent  Anesthesia Plan: Spinal   Post-op Pain Management:    Induction:   PONV Risk Score and Plan: 3 and Treatment may vary due to age or medical condition, Ondansetron and Scopolamine patch - Pre-op  Airway Management Planned: Natural Airway  Additional Equipment:   Intra-op Plan:   Post-operative Plan:   Informed Consent: I have reviewed the patients History and Physical, chart, labs and discussed the procedure including the risks, benefits and alternatives for the proposed anesthesia with the patient or authorized representative who has indicated his/her understanding and acceptance.      Dental advisory given  Plan Discussed with: Anesthesiologist  Anesthesia Plan Comments:         Anesthesia Quick Evaluation

## 2021-08-24 NOTE — Anesthesia Procedure Notes (Signed)
Spinal  Patient location during procedure: OR Start time: 08/24/2021 9:21 AM End time: 08/24/2021 9:24 AM Reason for block: surgical anesthesia Staffing Performed: anesthesiologist  Anesthesiologist: Mal Amabile, MD Preanesthetic Checklist Completed: patient identified, IV checked, site marked, risks and benefits discussed, surgical consent, monitors and equipment checked, pre-op evaluation and timeout performed Spinal Block Patient position: sitting Prep: DuraPrep and site prepped and draped Patient monitoring: heart rate, cardiac monitor, continuous pulse ox and blood pressure Approach: midline Location: L3-4 Injection technique: single-shot Needle Needle type: Pencan  Needle gauge: 24 G Needle length: 9 cm Needle insertion depth: 6 cm Assessment Sensory level: T4 Events: paresthesia and CSF return Additional Notes Patient tolerated procedure well. Adequate sensory level. Transient paresthesia right leg

## 2021-08-24 NOTE — Progress Notes (Signed)
° ° °  Faculty Practice OB/GYN Attending Note  Subjective:  Came to check on patient after cesarean section earlier today.  PACU course complicated by several severe BP, received scheduled Procardia XL 30 mg po which did not bring her BP down, so she required Labetalol IV protocol initiation. magnesium sulfate also started for eclampsia prophylaxis.  Currently, patient is doing well in room.  Patient denies any headaches, visual symptoms, RUQ/epigastric pain or other concerning symptoms. Has moderate incisional pain.  Baby is doing well in NICU.   Objective:  Blood pressure 133/86, pulse 74, temperature 97.7 F (36.5 C), temperature source Oral, resp. rate 18, height 5\' 1"  (1.549 m), weight 69.3 kg, last menstrual period 12/30/2020, SpO2 100 %, unknown if currently breastfeeding. Vitals:   08/24/21 1228 08/24/21 1230 08/24/21 1240 08/24/21 1338  BP: 126/89 135/90 130/86 133/86  Pulse: 73 70 69 74  Resp: 20 18 18 18   Temp:   97.7 F (36.5 C)   TempSrc:   Oral   SpO2: 99%  100%   Weight:      Height:       Gen: NAD HENT: Normocephalic, atraumatic Lungs: Normal respiratory effort Heart: Regular rate noted Abdomen: soft, moderate TTP, fundus firm below umbilicus Pelvic: Deferred Ext: 2+ DTRs, no edema, no cyanosis, negative Homan's sign  Assessment & Plan:  25 y.o. POD#0 PLTCS/IUD at [redacted]w[redacted]d for BPP 2/10, severe FGR, placental abruption, now with severe preeclampsia. 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 - Continue Procardia XL 30 qd, Labetalol IV prn. Continue magnesium sulfate for 24 hours as per protocol.  - OB Anesthesiologist consulted about her pain, he recommended to give her Methadone dose now. This was given, will monitor effect. Continue Toradol and Neurontin as ordered  - Patient needs UDS done as per Lactation services prior to consultation, talked to patient about this and she gave verbal consent. This was ordered, will follow up results and manage accordingly. -  Continue close observation and routine postpartum/postoperative care.   [redacted]w[redacted]d, MD, FACOG Obstetrician & Gynecologist, [facility] for Jaynie Collins, [facility]

## 2021-08-24 NOTE — Lactation Note (Signed)
This note was copied from a baby's chart. Lactation Consultation Note  Patient Name: [name] [id number] Date: 08/24/2021   Age:26 hours  OB Specialty care RN Princes called NICU [facility] and notified that mom has decided to formula feed only. LC services no longer needed.  Consult Status Consult Status: Complete (formula)   [name] [name] 08/24/2021, 2:36 PM

## 2021-08-24 NOTE — Anesthesia Postprocedure Evaluation (Signed)
Anesthesia Post Note  Patient: [name]  Procedure(s) Performed: CESAREAN SECTION     Patient location during evaluation: PACU Anesthesia Type: Spinal Level of consciousness: oriented and awake and alert Pain management: pain level controlled Vital Signs Assessment: post-procedure vital signs reviewed and stable Respiratory status: spontaneous breathing, respiratory function stable and nonlabored ventilation Cardiovascular status: blood pressure returned to baseline and stable Postop Assessment: no headache, no backache, no apparent nausea or vomiting, spinal receding and patient able to bend at knees Anesthetic complications: no   No notable events documented.  Last Vitals:  Vitals:   08/24/21 1214 08/24/21 1215  BP: (!) 180/93 (!) 180/93  Pulse: 60 (!) 54  Resp: 20 18  Temp:  36.8 C  SpO2: 99% 100%    Last Pain:  Vitals:   08/24/21 1215  TempSrc:   PainSc: 6    Pain Goal:    LLE Motor Response: Purposeful movement (08/24/21 1214)   RLE Motor Response: Purposeful movement (08/24/21 1214)       Epidural/Spinal Function Cutaneous sensation: Able to Discern Pressure (08/24/21 1145), Patient able to flex knees: Yes (08/24/21 1145), Patient able to lift hips off bed: No (08/24/21 1145), Back pain beyond tenderness at insertion site: No (08/24/21 1145), Progressively worsening motor and/or sensory loss: No (08/24/21 1145), Bowel and/or bladder incontinence post epidural: No (08/24/21 1145)  Ayumi Wangerin A.

## 2021-08-24 NOTE — Lactation Note (Signed)
This note was copied from a baby's chart. Lactation Consultation Note  Patient Name: Girl Amarri Michaelson [id number] Date: 08/24/2021   Age:26 hours  Lactation orders received and acknowledged. Spoke to mother's provider Dr. Macon Large and she agreed to wait to offer breastmilk to this baby until drug screen is completed, mom admitted heroin use during the last year (the entire pregnancy) last time was on 08/21/21.   No feeding choice on admission either, but OB Specialty care RN Princess reported that mom wished to pump. Mom's RN aware that we can't provide mother's milk to baby until drug screen comes back; will update plan of care at that time.   Hanaa Payes S Malory Spurr 08/24/2021, 2:06 PM

## 2021-08-24 NOTE — Transfer of Care (Signed)
Immediate Anesthesia Transfer of Care Note  Patient: [name]  Procedure(s) Performed: CESAREAN SECTION  Patient Location: PACU  Anesthesia Type:Spinal  Level of Consciousness: awake, alert  and oriented  Airway & Oxygen Therapy: Patient Spontanous Breathing  Post-op Assessment: Report given to RN and Post -op Vital signs reviewed and stable  Post vital signs: Reviewed and stable  Last Vitals:  Vitals Value Taken Time  BP 144/95 08/24/21 1045  Temp    Pulse 48 08/24/21 1049  Resp 16 08/24/21 1050  SpO2 100 % 08/24/21 1049  Vitals shown include unvalidated device data.  Last Pain:  Vitals:   08/24/21 0900  TempSrc:   PainSc: 0-No pain         Complications: No notable events documented.

## 2021-08-24 NOTE — Consult Note (Signed)
MFM Note  This patient has been hospitalized due to vaginal bleeding.  She has a significant history of substance (heroin) abuse.  Severe IUGR was noted on her recent growth ultrasound.  She has received a complete course of antenatal corticosteroids. Her blood pressures remain mildly elevated in the 140s over 90s range.  A BPP performed today was 2 out of 8.  She received a score of 2 for a 2 centimeter pocket of amniotic fluid.  Very little fetal movement, no fetal breathing movements, and no fetal tone was noted.    The amniotic fluid level was also low with an AFI of 4.4 cm.  Doppler studies of the umbilical arteries performed today showed a normal S/D ratio of 2.65.  There were no signs of absent or reversed end-diastolic flow noted.  The left lateral placenta appears to have multiple areas of infarction.  Due to concerns regarding the fetal status given her BPP of 2 out of 8, severe IUGR, low amniotic fluid levels, and her mildly elevated blood pressures, delivery is recommended.    As the fetus is in the breech presentation, she will require a cesarean delivery.

## 2021-08-25 DIAGNOSIS — O9081 Anemia of the puerperium: Secondary | ICD-10-CM | POA: Diagnosis present

## 2021-08-25 LAB — COMPREHENSIVE METABOLIC PANEL
ALT: 19 U/L (ref 0–44)
AST: 21 U/L (ref 15–41)
Albumin: 1.9 g/dL — ABNORMAL LOW (ref 3.5–5.0)
Alkaline Phosphatase: 97 U/L (ref 38–126)
Anion gap: 6 (ref 5–15)
BUN: 9 mg/dL (ref 6–20)
CO2: 23 mmol/L (ref 22–32)
Calcium: 6 mg/dL — CL (ref 8.9–10.3)
Chloride: 104 mmol/L (ref 98–111)
Creatinine, Ser: 0.7 mg/dL (ref 0.44–1.00)
GFR, Estimated: >60 mL/min (ref >60)
Glucose, Bld: 94 mg/dL (ref 70–99)
Potassium: 3.9 mmol/L (ref 3.5–5.1)
Sodium: 133 mmol/L — ABNORMAL LOW (ref 135–145)
Total Bilirubin: 0.2 mg/dL — ABNORMAL LOW (ref 0.3–1.2)
Total Protein: 5.2 g/dL — ABNORMAL LOW (ref 6.5–8.1)

## 2021-08-25 LAB — CBC
HCT: 28.6 % — ABNORMAL LOW (ref 36.0–46.0)
Hemoglobin: 9.6 g/dL — ABNORMAL LOW (ref 12.0–15.0)
MCH: 31.4 pg (ref 26.0–34.0)
MCHC: 33.6 g/dL (ref 30.0–36.0)
MCV: 93.5 fL (ref 80.0–100.0)
Platelets: 247 10*3/uL (ref 150–400)
RBC: 3.06 MIL/uL — ABNORMAL LOW (ref 3.87–5.11)
RDW: 14 % (ref 11.5–15.5)
WBC: 24.3 10*3/uL — ABNORMAL HIGH (ref 4.0–10.5)
nRBC: 0.2 % (ref 0.0–0.2)

## 2021-08-25 MED ORDER — CALCIUM GLUCONATE-NACL 1-0.675 GM/50ML-% IV SOLN: 1.0000 g | Freq: Once | INTRAVENOUS | Status: DC

## 2021-08-25 MED ORDER — CALCIUM GLUCONATE-NACL 1-0.675 GM/50ML-% IV SOLN
1.0000 g | Freq: Once | INTRAVENOUS | Status: AC
  Administered 2021-08-25: 10:00:00 1000 mg via INTRAVENOUS
  Filled 2021-08-25: qty 50

## 2021-08-25 MED ORDER — FERROUS SULFATE 325 (65 FE) MG PO TABS: 325.0000 mg | ORAL_TABLET | ORAL | Status: DC

## 2021-08-25 NOTE — Progress Notes (Signed)
Date and time results received: 08/25/21 (use smartphrase ".now" to insert current time)  Test: calcium Critical Value: 6.0  Name of Provider Notified: Dr Jolayne Panther  Orders Received? Or Actions Taken?:  no new orders given. No cardiac monitoring at this time.

## 2021-08-25 NOTE — Progress Notes (Signed)
CSW completed assessment on 08/23/2021.  CSW made MOB aware that CSW made a [place] CPS report due to MOB's substance use during pregnancy (report made to intake worker Ashley).   ° °CSW met with MOB at MOB's bedside to review NICU visitation and resource sand supports that are available to MOB while infant remains inpatient.  Per MOB, she has made contact with residential facilities but have not been successful with an admission. MOB agreed to reach out to CSW if additional help is needed.  ° °CSW will continue to monitor infant's CDS and will report results to CPS.  ° °At this time there are barriers to infant's discharge.  ° °CSW will continue to offer resources and supports to family while infant remains in NICU.  °  °[name], MSW, LCSW °Clinical Social Work °[phone] °

## 2021-08-25 NOTE — Progress Notes (Addendum)
Subjective: Postpartum Day 1: Cesarean Delivery Patient reports feeling well. She denies HA, visual changes, RUQ/epigastric pain. Patient ambulated in room and denies chest pain, SOB, lightheadedness/dizziness.    Objective: Vital signs in last 24 hours: Temp:  [97.6 F (36.4 C)-98.2 F (36.8 C)] 98 F (36.7 C) (12/14 0400) Pulse Rate:  [50-97] 88 (12/14 0400) Resp:  [12-20] 18 (12/14 0400) BP: (104-180)/(71-106) 104/73 (12/14 0400) SpO2:  [95 %-100 %] 95 % (12/14 0400)  Physical Exam:  General: alert, cooperative, and no distress Lochia: appropriate Uterine Fundus: firm Incision: honeycomb dressing centrally stained with old blood DVT Evaluation: No evidence of DVT seen on physical exam.  Recent Labs    08/24/21 0509 08/25/21 0523  HGB 10.3* 9.6*  HCT 31.8* 28.6*    Assessment/Plan: Status post Cesarean section. Doing well postoperatively.  Continue magnesium sulfate for a total of 24 hours from delivery time. Continue procardia Will replete calcium Continue methadone due to substance use disorder Follow up social work consult Continue routine post operative care  [name] 08/25/2021, 7:28 AM

## 2021-08-25 NOTE — Progress Notes (Signed)
Patient ambulated to patio and was seen smoking. RN instructed patient that this was a no smoking facility and educated patient on the contraindication of smoking while wearing a nicotine patch. Notified Dr. Macon Large and received orders to discontinue nicotine patch.

## 2021-08-26 ENCOUNTER — Other Ambulatory Visit (HOSPITAL_COMMUNITY): Payer: Self-pay

## 2021-08-26 LAB — SURGICAL PATHOLOGY

## 2021-08-26 LAB — HCV RNA QUANT RFLX ULTRA OR GENOTYP
HCV RNA Qnt(log copy/mL): 6.305 log10 IU/mL
HepC Qn: 2020000 IU/mL

## 2021-08-26 LAB — HEPATITIS C GENOTYPE

## 2021-08-26 MED ORDER — FUROSEMIDE 20 MG PO TABS
20.0000 mg | ORAL_TABLET | Freq: Two times a day (BID) | ORAL | 0 refills | Status: AC
  Filled 2021-08-26: qty 10, 5d supply, fill #0

## 2021-08-26 MED ORDER — BUSPIRONE HCL 10 MG PO TABS
10.0000 mg | ORAL_TABLET | Freq: Three times a day (TID) | ORAL | 0 refills | Status: AC
  Filled 2021-08-26: qty 30, 10d supply, fill #0

## 2021-08-26 MED ORDER — NIFEDIPINE ER 30 MG PO TB24
30.0000 mg | ORAL_TABLET | Freq: Every day | ORAL | 0 refills | Status: AC
  Filled 2021-08-26: qty 30, 30d supply, fill #0

## 2021-08-26 MED ORDER — SENNOSIDES-DOCUSATE SODIUM 8.6-50 MG PO TABS
2.0000 | ORAL_TABLET | Freq: Every evening | ORAL | 2 refills | Status: AC | PRN
  Filled 2021-08-26: qty 30, 15d supply, fill #0

## 2021-08-26 MED ORDER — IBUPROFEN 600 MG PO TABS
600.0000 mg | ORAL_TABLET | Freq: Four times a day (QID) | ORAL | 0 refills | Status: AC
  Filled 2021-08-26: qty 30, 8d supply, fill #0

## 2021-08-26 MED ORDER — OXYCODONE HCL 5 MG PO TABS
5.0000 mg | ORAL_TABLET | ORAL | 0 refills | Status: AC | PRN
  Filled 2021-08-26: qty 30, 5d supply, fill #0

## 2021-08-31 ENCOUNTER — Encounter: Payer: Medicaid Other | Admitting: Obstetrics & Gynecology

## 2021-09-03 ENCOUNTER — Ambulatory Visit (INDEPENDENT_AMBULATORY_CARE_PROVIDER_SITE_OTHER): Payer: Medicaid Other | Admitting: Obstetrics & Gynecology

## 2021-09-03 ENCOUNTER — Encounter: Payer: Self-pay | Admitting: Obstetrics & Gynecology

## 2021-09-03 ENCOUNTER — Other Ambulatory Visit: Payer: Self-pay | Admitting: Obstetrics & Gynecology

## 2021-09-03 ENCOUNTER — Other Ambulatory Visit: Payer: Self-pay

## 2021-09-03 VITALS — BP 130/85 | HR 82 | Ht 61.0 in | Wt 138.0 lb

## 2021-09-03 DIAGNOSIS — Z98891 History of uterine scar from previous surgery: Secondary | ICD-10-CM | POA: Diagnosis not present

## 2021-09-03 MED ORDER — ENALAPRIL MALEATE 10 MG PO TABS: 10.0000 mg | ORAL_TABLET | Freq: Every day | ORAL | 1 refills | Status: DC

## 2021-09-03 NOTE — Progress Notes (Signed)
°  [id number]  Patient returns for routine postoperative follow-up having undergone primary Caesarean section on 08/24/21.  The patient's immediate postoperative recovery has been unremarkable. Since hospital discharge the patient reports could not take the procardia due to side effects.   Current Outpatient Medications: enalapril (VASOTEC) 10 MG tablet, Take 1 tablet (10 mg total) by mouth daily., Disp: 30 tablet, Rfl: 1 ibuprofen (ADVIL) 600 MG tablet, Take 1 tablet (600 mg total) by mouth every 6 (six) hours., Disp: 30 tablet, Rfl: 0 oxyCODONE (OXY IR/ROXICODONE) 5 MG immediate release tablet, Take 1 tablet (5 mg total) by mouth every 4 (four) hours as needed for severe pain., Disp: 30 tablet, Rfl: 0 busPIRone (BUSPAR) 10 MG tablet, Take 1 tablet (10 mg total) by mouth 3 (three) times daily. (Patient not taking: Reported on 09/03/2021), Disp: 30 tablet, Rfl: 0 furosemide (LASIX) 20 MG tablet, Take 1 tablet (20 mg total) by mouth 2 (two) times daily for 5 days., Disp: 10 tablet, Rfl: 0 NIFEdipine (ADALAT CC) 30 MG 24 hr tablet, Take 1 tablet (30 mg total) by mouth daily. (Patient not taking: Reported on 09/03/2021), Disp: 30 tablet, Rfl: 0 senna-docusate (SENOKOT-S) 8.6-50 MG tablet, Take 2 tablets by mouth at bedtime as needed for mild constipation or moderate constipation. (Patient not taking: Reported on 09/03/2021), Disp: 30 tablet, Rfl: 2  No current facility-administered medications for this visit.    Blood pressure 130/85, pulse 82, height 5\' 1"  (1.549 m), weight 138 lb (62.6 kg), not currently breastfeeding.  Physical Exam: Incision clean dry intac IUD strings cut  Diagnostic Tests:   Pathology:   Impression: Normal post op Doing well on methadone 30 Baby progressing well Change to vasotec 10   Plan: Orders Placed This Encounter     enalapril (VASOTEC) 10 MG tablet         Sig: Take 1 tablet (10 mg total) by mouth daily.         Dispense:  30 tablet          Refill:  1    Follow up: No follow-ups on file.   , MD

## 2021-09-07 ENCOUNTER — Telehealth (HOSPITAL_COMMUNITY): Payer: Self-pay

## 2021-09-07 DIAGNOSIS — Z1331 Encounter for screening for depression: Secondary | ICD-10-CM

## 2021-09-07 NOTE — Telephone Encounter (Addendum)
No answer. Left message to return nurse call.  EPDS score on 08/25/2021 was 17. Referral placed for integrated behavioral health.  Marcelino Duster [facility] 09/07/2021,1601

## 2021-09-09 ENCOUNTER — Telehealth: Payer: Self-pay | Admitting: Clinical

## 2021-09-09 NOTE — Telephone Encounter (Signed)
Attempt to schedule, per referral; Left HIPPA-compliant message to call back Sausha Raymond from [facility] at  [phone] (Zimir Kittleson's office); left MyChart message for pt. °

## 2021-09-28 ENCOUNTER — Ambulatory Visit: Payer: Medicaid Other | Admitting: Women's Health

## 2023-03-23 ENCOUNTER — Ambulatory Visit: Payer: Self-pay | Admitting: Adult Health

## 2023-03-28 ENCOUNTER — Encounter (HOSPITAL_COMMUNITY): Payer: Self-pay

## 2023-03-28 ENCOUNTER — Emergency Department (HOSPITAL_COMMUNITY)
Admission: EM | Admit: 2023-03-28 | Discharge: 2023-03-29 | Disposition: A | Discharge status: Home / Self Care | Payer: Medicaid Other | Attending: Emergency Medicine | Admitting: Emergency Medicine

## 2023-03-28 ENCOUNTER — Other Ambulatory Visit: Payer: Self-pay

## 2023-03-28 ENCOUNTER — Emergency Department (HOSPITAL_COMMUNITY): Payer: Medicaid Other

## 2023-03-28 DIAGNOSIS — M7989 Other specified soft tissue disorders: Secondary | ICD-10-CM | POA: Diagnosis not present

## 2023-03-28 DIAGNOSIS — S53402A Unspecified sprain of left elbow, initial encounter: Secondary | ICD-10-CM

## 2023-03-28 DIAGNOSIS — S59902A Unspecified injury of left elbow, initial encounter: Secondary | ICD-10-CM | POA: Insufficient documentation

## 2023-03-28 DIAGNOSIS — W130XXA Fall from, out of or through balcony, initial encounter: Secondary | ICD-10-CM | POA: Insufficient documentation

## 2023-03-28 NOTE — ED Triage Notes (Signed)
Pt reports she fell off the porch this morning and has pain in her left elbow down to her wrist.

## 2023-03-29 ENCOUNTER — Emergency Department (HOSPITAL_COMMUNITY): Payer: Medicaid Other

## 2023-03-29 NOTE — Discharge Instructions (Signed)
Wear arm sling for comfort and support.  Ice for 20 minutes every 2 hours while awake for the next 2 days.  Take ibuprofen 600 mg every 6 hours as needed for pain.  After the next 2 to 3 days, gradually begin to reintroduce activity as tolerated.  Follow-up with primary doctor if not improving in the next week.

## 2023-03-29 NOTE — ED Notes (Signed)
Pt has left arm swelling to left elbow, with pain noted There is a little redness to area.

## 2023-03-29 NOTE — ED Provider Notes (Signed)
[employer] EMERGENCY DEPARTMENT AT [facility] Provider Note   CSN: [id number] Arrival date & time: 03/28/23  1958     History  Chief Complaint  Patient presents with   Elbow Injury    [name] is a 27 y.o. female.  Patient is a 27 year old female presenting with a left elbow injury.  She reports falling off of her porch this morning and landing on her elbow.  She has had pain and swelling since.  She denies other injury.  Pain is worse with movement and palpation.  She denies any weakness or numbness.  No alleviating factors.  The history is provided by the patient.       Home Medications Prior to Admission medications   Medication Sig Start Date End Date Taking? Authorizing Provider  busPIRone (BUSPAR) 10 MG tablet Take 1 tablet (10 mg total) by mouth 3 (three) times daily. Patient not taking: Reported on 09/03/2021 08/26/21   Tereso Newcomer, MD  enalapril (VASOTEC) 10 MG tablet TAKE 1 TABLET(10 MG) BY MOUTH DAILY 09/03/21   Lazaro Arms, MD  furosemide (LASIX) 20 MG tablet Take 1 tablet (20 mg total) by mouth 2 (two) times daily for 5 days. 08/26/21 08/31/21  Anyanwu, Jethro Bastos, MD  ibuprofen (ADVIL) 600 MG tablet Take 1 tablet (600 mg total) by mouth every 6 (six) hours. 08/26/21   Anyanwu, Jethro Bastos, MD  NIFEdipine (ADALAT CC) 30 MG 24 hr tablet Take 1 tablet (30 mg total) by mouth daily. Patient not taking: Reported on 09/03/2021 08/27/21   Tereso Newcomer, MD  oxyCODONE (OXY IR/ROXICODONE) 5 MG immediate release tablet Take 1 tablet (5 mg total) by mouth every 4 (four) hours as needed for severe pain. 08/26/21   Anyanwu, Jethro Bastos, MD  senna-docusate (SENOKOT-S) 8.6-50 MG tablet Take 2 tablets by mouth at bedtime as needed for mild constipation or moderate constipation. Patient not taking: Reported on 09/03/2021 08/26/21   Tereso Newcomer, MD      Allergies    Patient has no known allergies.    Review of Systems   Review of Systems  All other  systems reviewed and are negative.   Physical Exam Updated Vital Signs BP 117/79 (BP Location: Right Arm)   Pulse 85   Temp 98.6 F (37 C) (Oral)   Resp 16   Ht 5\' 1"  (1.549 m)   Wt 56.7 kg   LMP 03/10/2023 (Approximate)   SpO2 93%   BMI 23.62 kg/m  Physical Exam Vitals and nursing note reviewed.  Constitutional:      Appearance: Normal appearance.  HENT:     Head: Normocephalic.  Pulmonary:     Effort: Pulmonary effort is normal.  Musculoskeletal:     Comments: There is swelling of the left elbow joint, but no obvious deformity.  She has pain with range of motion, but no crepitus.  Ulnar and radial pulses are easily palpable.  She is able to flex, extend, and oppose all fingers and sensation is intact throughout the entire hand.  Skin:    General: Skin is warm and dry.  Neurological:     Mental Status: She is alert.     ED Results / Procedures / Treatments   Labs (all labs ordered are listed, but only abnormal results are displayed) Labs Reviewed - No data to display  EKG None  Radiology DG Elbow Complete Left  Result Date: 03/28/2023 CLINICAL DATA:  Pain in left elbow radiating to left wrist  after fall EXAM: LEFT ELBOW - COMPLETE 3+ VIEW COMPARISON:  None Available. FINDINGS: Large elbow joint effusion. A discrete fracture is not definitively seen. Unremarkable soft tissues. IMPRESSION: Large elbow joint effusion. No definitive fracture is seen. CT is recommended to evaluate for occult fracture. Electronically Signed   By: Minerva Fester M.D.   On: 03/28/2023 21:36   DG Forearm Left  Result Date: 03/28/2023 CLINICAL DATA:  Fall, left arm pain EXAM: LEFT FOREARM - 2 VIEW COMPARISON:  None Available. FINDINGS: There is no evidence of fracture or other focal bone lesions. Soft tissues are unremarkable. IMPRESSION: Negative. Electronically Signed   By: Helyn Numbers M.D.   On: 03/28/2023 21:34    Procedures Procedures    Medications Ordered in ED Medications -  No data to display  ED Course/ Medical Decision Making/ A&P  Patient is a 27 year old female presenting with a left elbow injury as described above.  Initial x-rays show a joint effusion for which a CT was recommended.  This was performed and shows no evidence for fracture or dislocation.  The previously demonstrated joint effusion was no longer visible.  Patient to be placed in an arm sling, advised to ice, rest, and follow-up as needed if not improving.  Final Clinical Impression(s) / ED Diagnoses Final diagnoses:  None    Rx / DC Orders ED Discharge Orders     None         Geoffery Lyons, MD 03/29/23 [phone]

## 2023-04-24 IMAGING — US US MFM UA CORD DOPPLER
1 series · 13 of 28 positions shown · non-contrast
Comparison: none

[Series 1: us mfm ua cord doppler · 59 acquisitions, 13 frames shown]
[im 3/59]
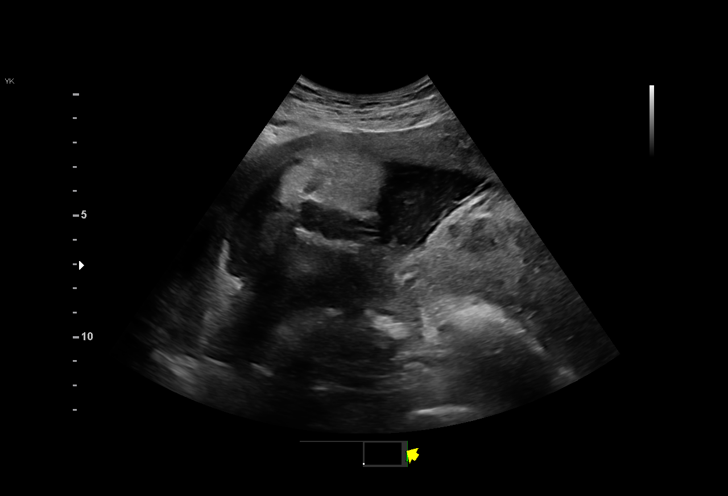
[im 7/59]
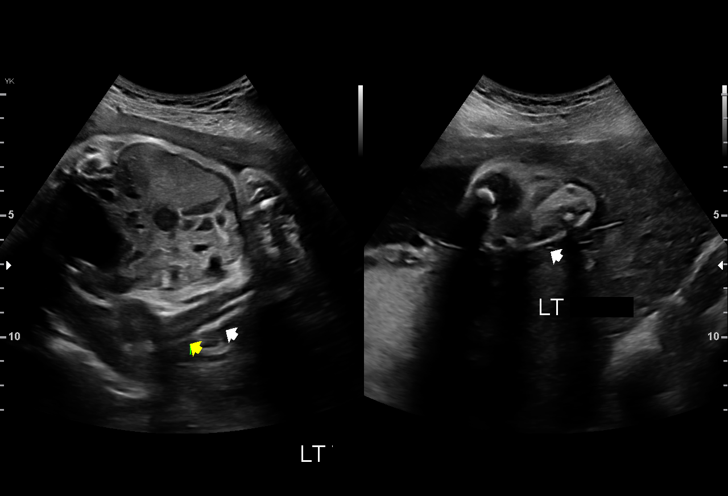
[im 11/59]
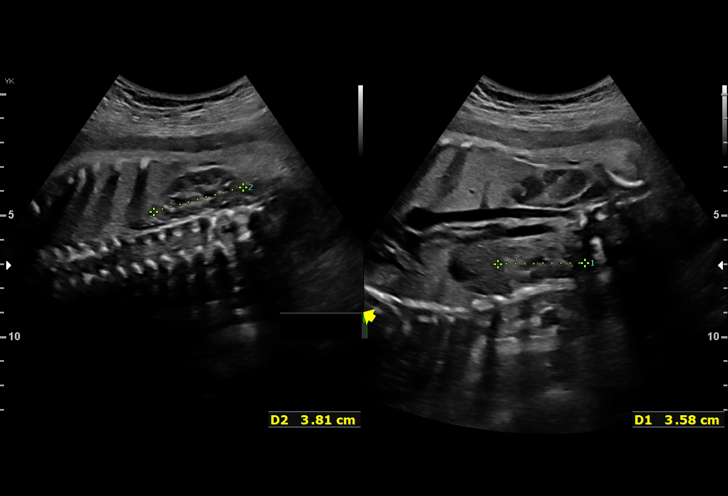
[im 16/59]
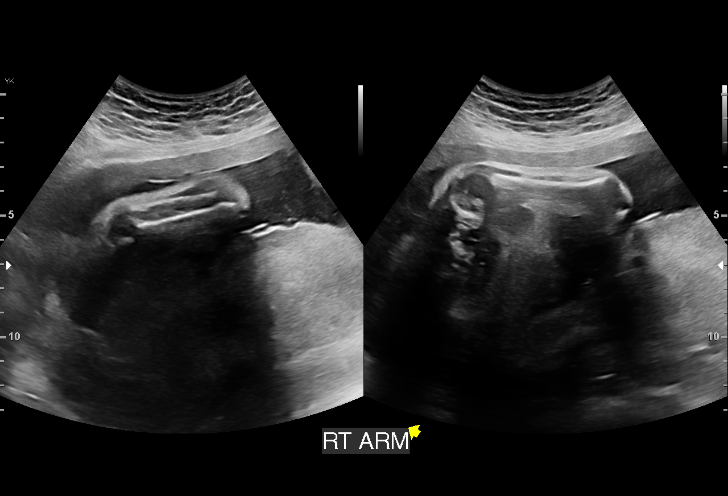
[im 20/59]
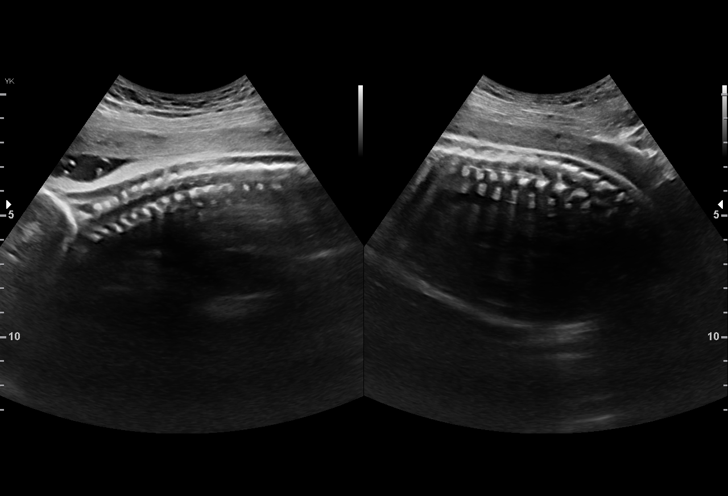
[im 24/59]
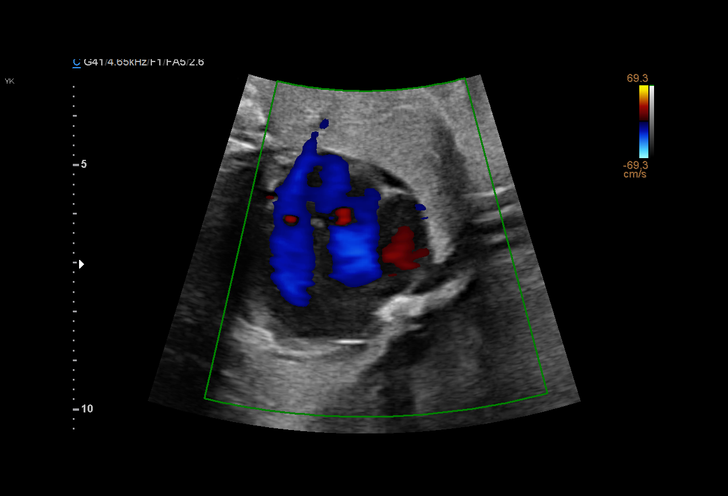
[im 31/59]
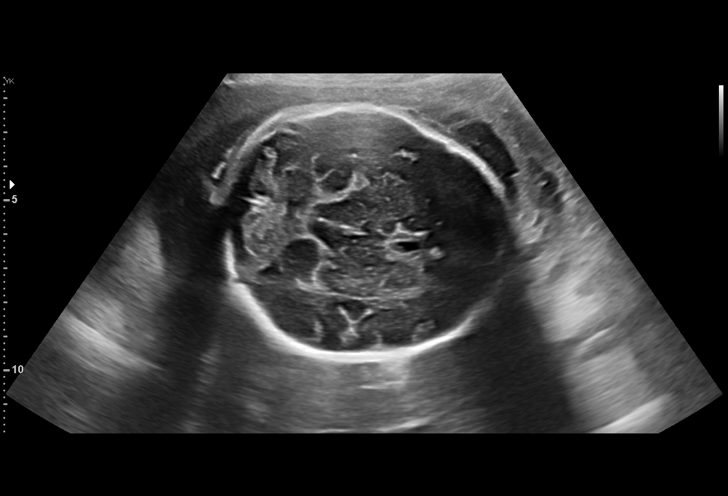
[im 35/59]
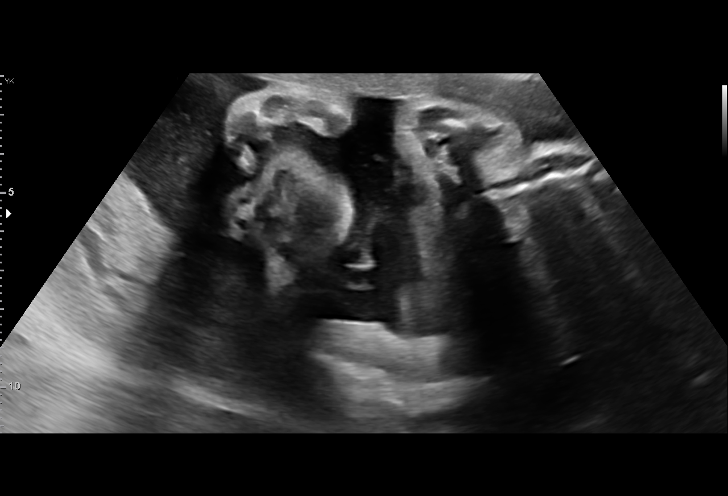
[im 39/59]
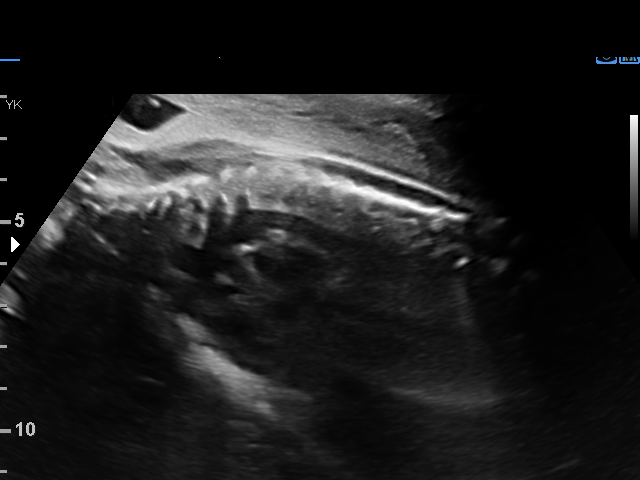
[im 43/59]
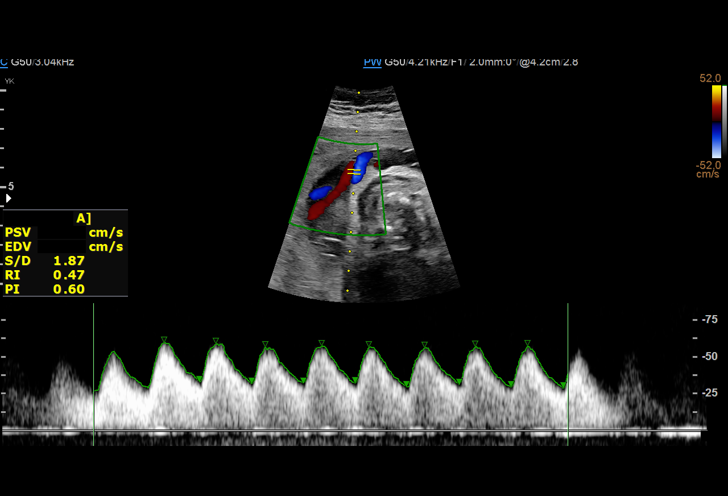
[im 48/59]
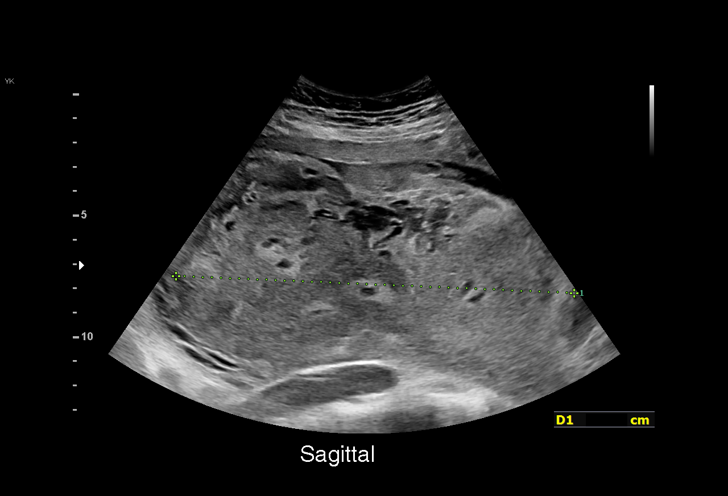
[im 52/59]
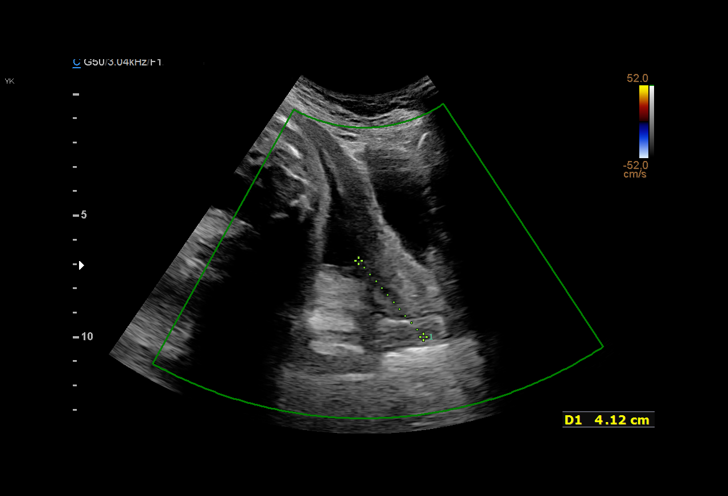
[im 56/59]
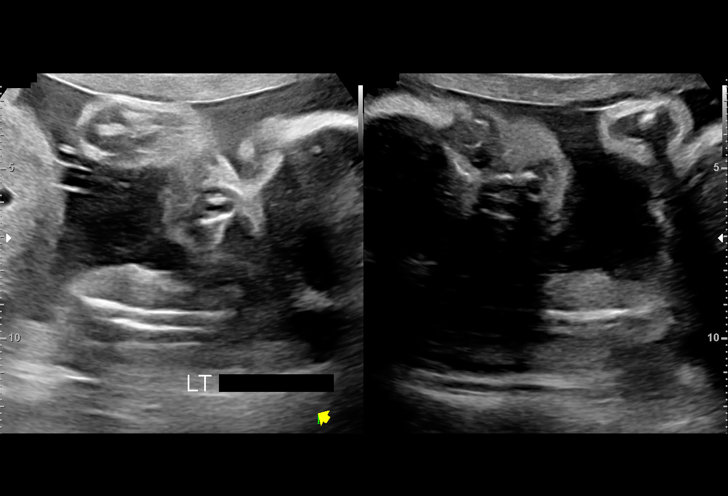

[13 of 28 positions shown; findings below may reference images not displayed]

CNM

 1  US MFM UA CORD DOPPLER                76820.02    NAZARETH JUMPER

Indications

 Vaginal bleeding in pregnancy, third trimester
 Insufficient Prenatal Care
 Late to prenatal care, third trimester
 Heroin/Methadone use
 Chronic Hepatitis C complicating pregnancy,
 antepartum
 Twin pregnancy, di/di, unspecified trimester
 (vanishing twin) twin demise
 33 weeks gestation of pregnancy
 Maternal care for excessive fetal growth,
 third trimester, fetus unspecified
Fetal Evaluation

 Num Of Fetuses:          1
 Fetal Heart Rate(bpm):   157
 Cardiac Activity:        Observed
 Presentation:            Breech
 Placenta:                Left lateral,large heterogeneous

 Amniotic Fluid
 AFI FV:      Within normal limits

 AFI Sum(cm)     %Tile       Largest Pocket(cm)
 10.7            24

 RUQ(cm)       RLQ(cm)       LUQ(cm)        LLQ(cm)
 3

 Comment:    Large placental hemorrhage  measuring :64x66.5x3 cm . No
             extenstion to the internal cervical os.                    No Placenta
             Previa seen today
Biometry

 BPD:      76.5  mm     G. Age:  30w 5d        < 1  %    CI:        76.07   %    70 - 86
                                                         FL/HC:       19.2  %    19.4 -
 HC:        278  mm     G. Age:  30w 3d        < 1  %    HC/AC:       1.07       0.96 -
 AC:      259.8  mm     G. Age:  30w 1d        < 1  %    FL/BPD:      69.8  %    71 - 87
 FL:       53.4  mm     G. Age:  28w 2d        < 1  %    FL/AC:       20.6  %    20 - 24

 Est. FW:    1691   gm     3 lb 2 oz    < 1  %
OB History

 Gravidity:    2
 Living:       1
Gestational Age

 Clinical EDD:  33w 4d                                        EDD:   10/06/21
 U/S Today:     29w 6d                                        EDD:   11/01/21
 Best:          33w 4d     Det. By:  Clinical EDD             EDD:   10/06/21
Anatomy

 Cranium:               Appears normal         Aortic Arch:            Appears normal
 Cavum:                 Appears normal         Ductal Arch:            Appears normal
 Ventricles:            Appears normal         Diaphragm:              Appears normal
 Choroid Plexus:        Appears normal         Stomach:                Appears normal, left
                                                                       sided
 Cerebellum:            Appears normal         Abdomen:                Appears normal
 Posterior Fossa:       Appears normal         Abdominal Wall:         Appears nml (cord
                                                                       insert, abd wall)
 Face:                  Appears normal         Cord Vessels:           Appears normal (3
                        (orbits and profile)                           vessel cord)
 Lips:                  Appears normal         Kidneys:                Appear normal
 Thoracic:              Appears normal         Bladder:                Appears normal
 Heart:                 Appears normal         Spine:                  Ltd views no
                        (4CH, axis, and                                intracranial signs of
                        situs)                                         NTD
 RVOT:                  Appears normal         Upper Extremities:      RT visualized. LT
                                                                       limited.
 LVOT:                  Not well visualized    Lower Extremities:      Visualized

 Other:  Lt ulna/radius seen, Hand and humerus not clearly seen.  Fetus
         appears to be female. Good breathing .
Doppler - Fetal Vessels

 Umbilical Artery
   S/D    %tile      RI    %tile      PI    %tile            ADFV    RDFV
   1.9       10    0.47      4.5    0.62      4.5               No      No

Cervix Uterus Adnexa

 Cervix
 Length:              3  cm.
 Normal appearance by transabdominal scan. No placental coverage
 seen.
 Uterus
 No abnormality visualized.
Impression

 Limited exam to assess vaginal bleeding and maternal history
 of substance abuse.
 Normal anatomy is seen today with measurements again
 consistent with severe fetal growth restriction <1%
 Good fetal movement and amniotic fluid
 Suboptimal views of the fetal anatomy are seen.

 The UA Dopplers demonstrated forward flow without
 evidence of AEDF or REDF.

 The large heterogenous posterior structure, suggestive of
 retroplacental bleed, is again seen the cervix appears >2 cm
 from this structure.
Recommendations

 Continue daily NST
 2x weekly UA Dopplers and BPP.
 Repeat growth in 3 weeks.

## 2023-04-26 IMAGING — US US MFM FETAL BPP W/O NON-STRESS
1 series · 15 of 28 positions shown · non-contrast
Comparison: none

[Series 1: us mfm fetal bpp w/o non-stress · 33 acquisitions, 15 frames shown]
[im 1/33]
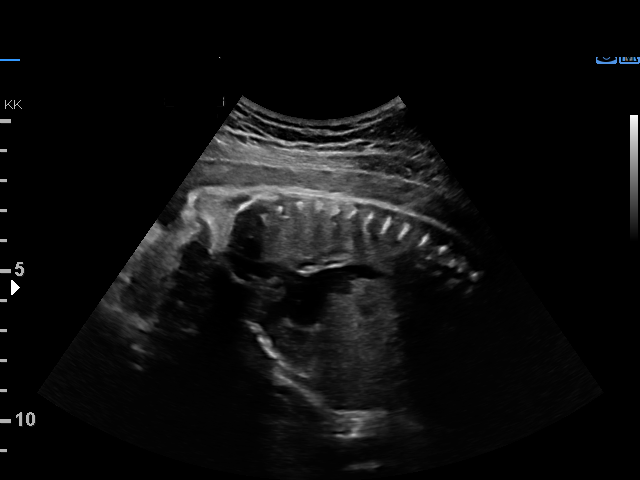
[im 3/33]
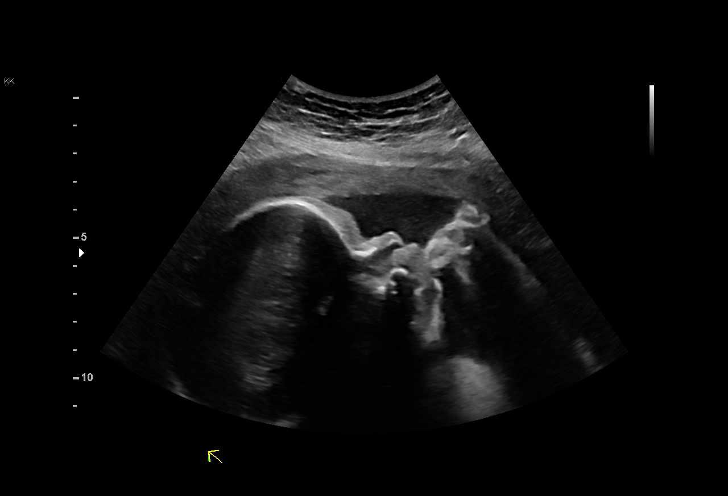
[im 5/33]
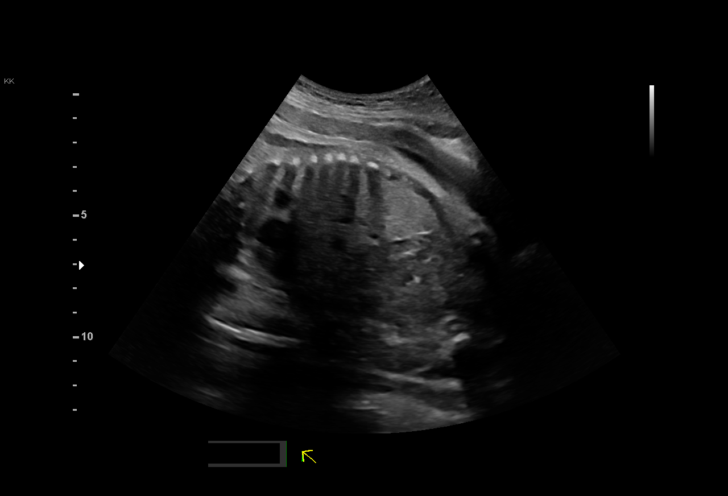
[im 8/33]
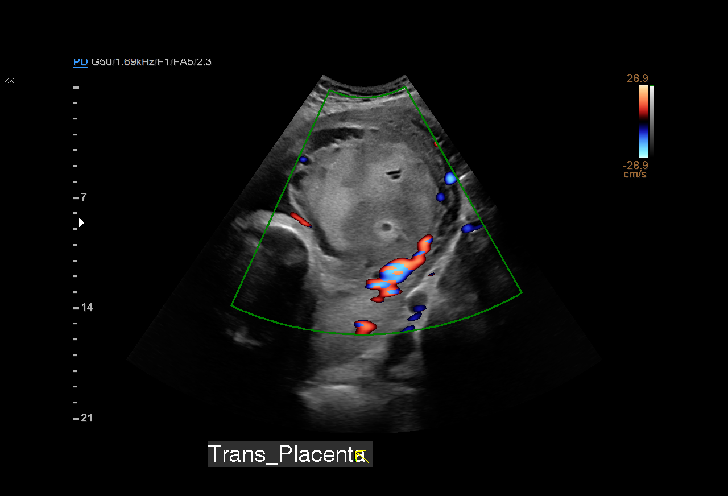
[im 10/33]
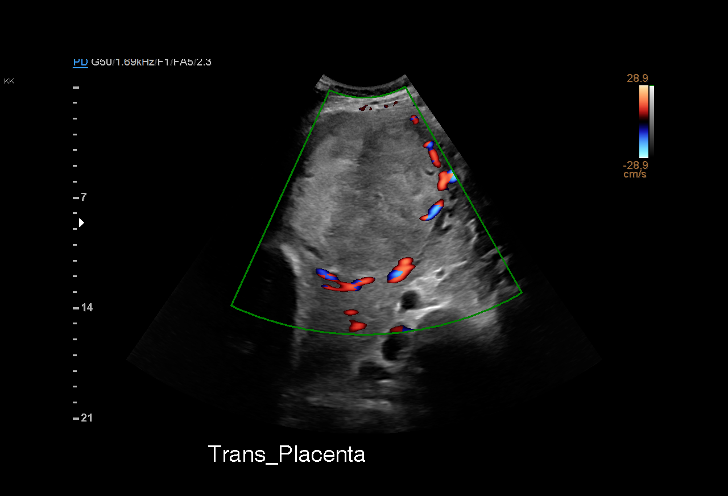
[im 12/33]
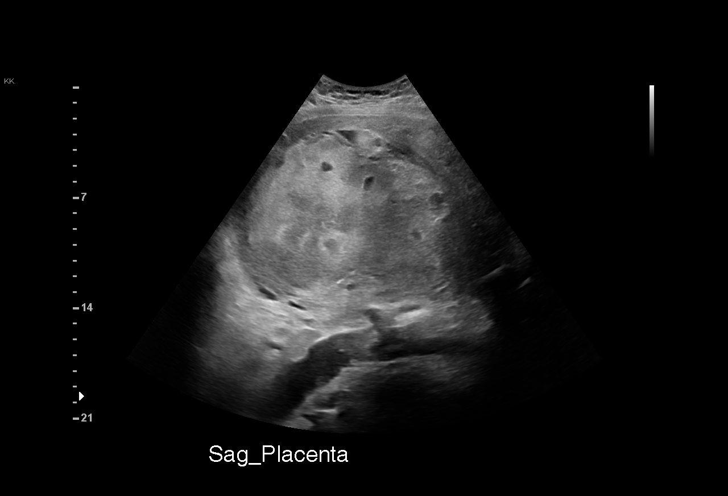
[im 15/33]
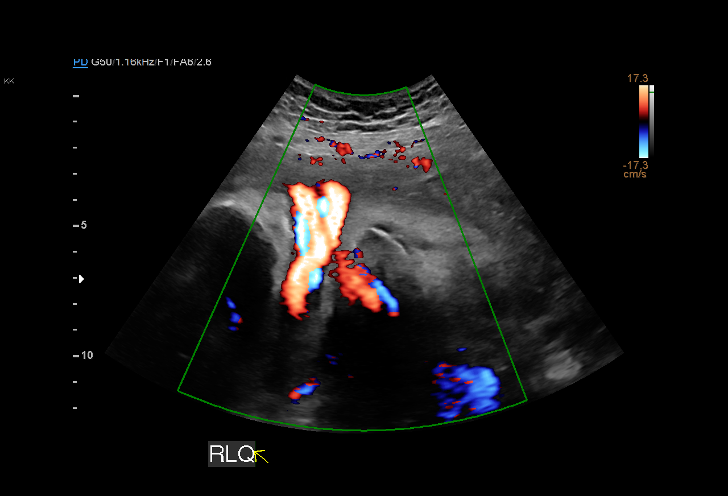
[im 17/33]
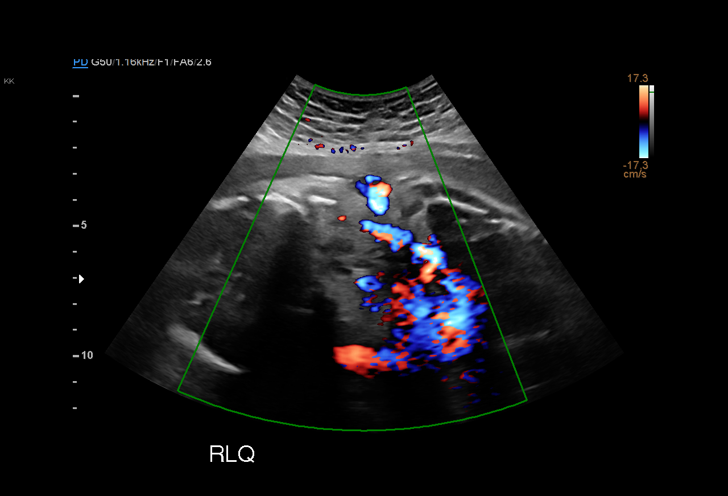
[im 18/33]
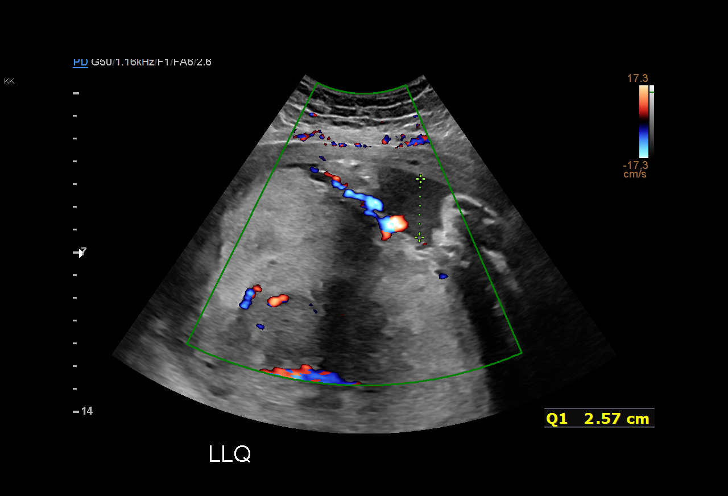
[im 21/33]
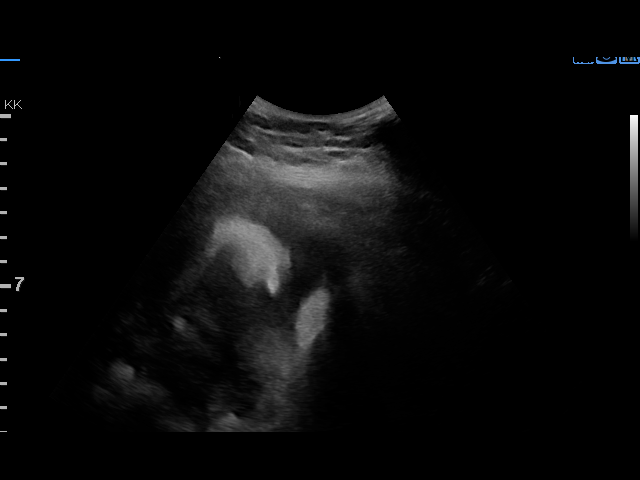
[im 23/33]
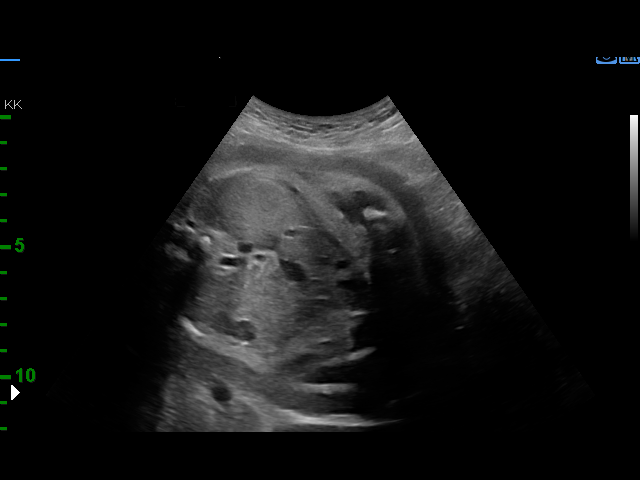
[im 25/33]
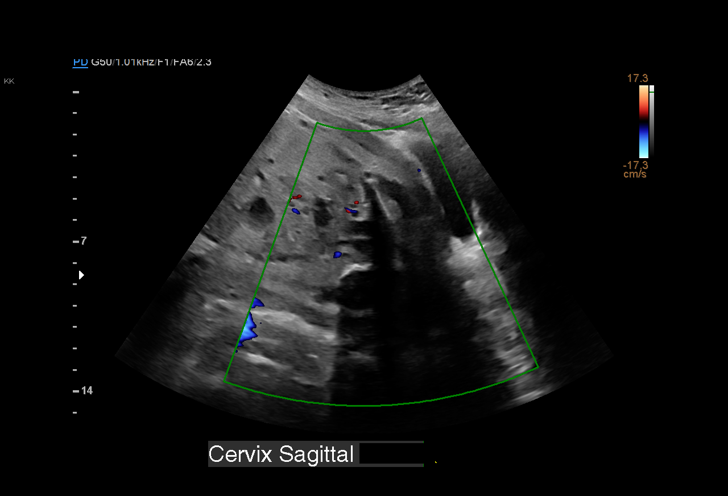
[im 28/33]
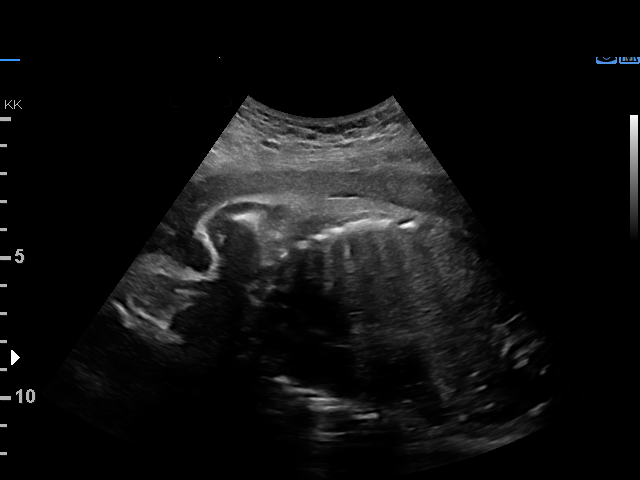
[im 30/33]
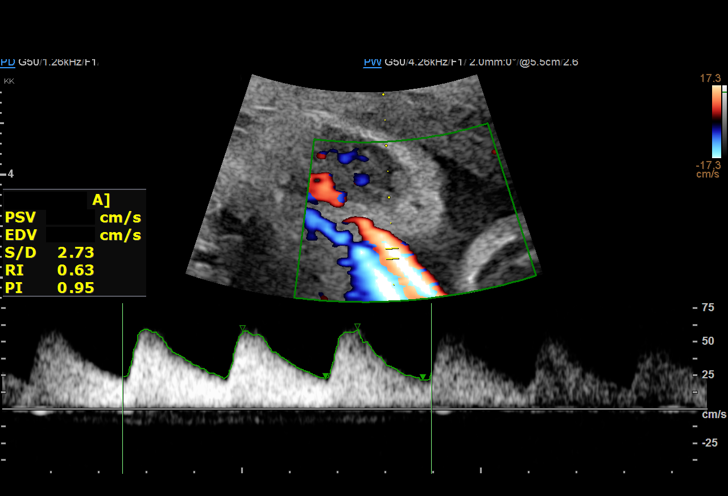
[im 33/33]
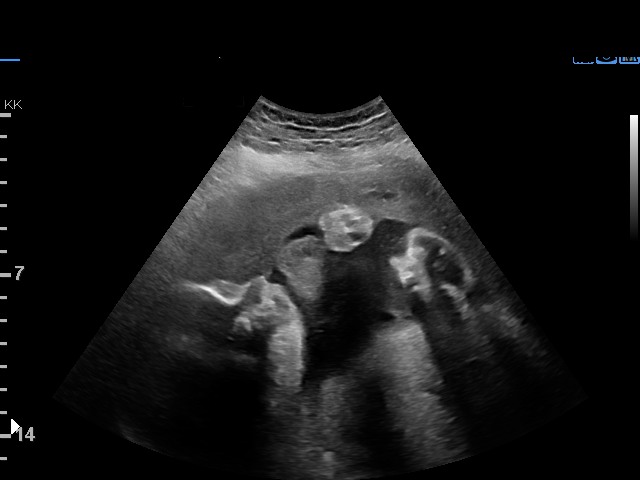

[15 of 28 positions shown; findings below may reference images not displayed]

CNM

 2  US MFM UA CORD DOPPLER                76820.02    KWOKTUNG LYNDON

Indications

 Vaginal bleeding in pregnancy, third trimester
 Late to prenatal care, third trimester
 Heroin/Methadone use
 Chronic Hepatitis C complicating pregnancy,
 antepartum
 33 weeks gestation of pregnancy
 Twin pregnancy, di/di, unspecified trimester
 (vanishing twin) twin demise
Fetal Evaluation

 Num Of Fetuses:         1
 Fetal Heart Rate(bpm):  160
 Cardiac Activity:       Observed
 Presentation:           Breech, footling
 Placenta:               Left lateral Heterogeneous

 Amniotic Fluid
 AFI FV:      Oligohydramnios

 AFI Sum(cm)     %Tile       Largest Pocket(cm)
 4.4             < 3

 RUQ(cm)                     LUQ(cm)

Biophysical Evaluation

 Amniotic F.V:   Pocket => 2 cm             F. Tone:        Not Observed
 F. Movement:    Not Observed               Score:          [DATE]
 F. Breathing:   Not Observed
OB History

 Gravidity:    2
 Living:       1
Gestational Age

 Clinical EDD:  33w 6d                                        EDD:   10/06/21
 Best:          33w 6d     Det. By:  Clinical EDD             EDD:   10/06/21
Doppler - Fetal Vessels

 Umbilical Artery
  S/D     %tile      RI    %tile      PI    %tile            ADFV    RDFV
  2.65       57    0.62       62    1.04       80               No      No

Comments

 This patient has been hospitalized due to vaginal bleeding.
 She has a significant history of substance (heroin) abuse.
 Severe IUGR was noted on her recent growth ultrasound.
 She has received a complete course of antenatal
 corticosteroids. Her blood pressures remain mildly elevated in
 the 140s over 90s range.

 A BPP performed today was [DATE].  She received a score
 of 2 for a 2 centimeter pocket of amniotic fluid.  Very little
 fetal movement, no fetal breathing movements, and no fetal
 tone was noted.

 The amniotic fluid level was also low with an AFI of 4.4 cm.

 Doppler studies of the umbilical arteries performed today
 showed a normal S/D ratio of 2.65.  There were no signs of
 absent or reversed end-diastolic flow noted.

 The left lateral placenta appears to have multiple areas of
 infarction.

 Due to concerns regarding the fetal status given her BPP [DATE], severe IUGR, low amniotic fluid levels, and her
 mildly elevated blood pressures, delivery is recommended.

 As the fetus is in the breech presentation, she will require a
 cesarean delivery.

## 2023-05-18 ENCOUNTER — Other Ambulatory Visit: Payer: Self-pay

## 2023-05-18 ENCOUNTER — Encounter (HOSPITAL_COMMUNITY): Payer: Self-pay | Admitting: Emergency Medicine

## 2023-05-18 ENCOUNTER — Emergency Department (HOSPITAL_COMMUNITY)
Admission: EM | Admit: 2023-05-18 | Discharge: 2023-05-18 | Disposition: A | Discharge status: Home / Self Care | Payer: Medicaid Other | Attending: Emergency Medicine | Admitting: Emergency Medicine

## 2023-05-18 DIAGNOSIS — M545 Low back pain, unspecified: Secondary | ICD-10-CM | POA: Diagnosis present

## 2023-05-18 DIAGNOSIS — N390 Urinary tract infection, site not specified: Secondary | ICD-10-CM | POA: Diagnosis not present

## 2023-05-18 LAB — URINALYSIS, ROUTINE W REFLEX MICROSCOPIC
Bilirubin Urine: NEGATIVE
Glucose, UA: NEGATIVE mg/dL
Hgb urine dipstick: NEGATIVE
Ketones, ur: 5 mg/dL — AB
Nitrite: NEGATIVE
Protein, ur: 30 mg/dL — AB
Specific Gravity, Urine: 1.029 (ref 1.005–1.030)
pH: 5 (ref 5.0–8.0)

## 2023-05-18 MED ORDER — CEPHALEXIN 500 MG PO CAPS: 500.0000 mg | ORAL_CAPSULE | Freq: Three times a day (TID) | ORAL | 0 refills | Status: AC

## 2023-05-18 NOTE — ED Triage Notes (Signed)
Pt reports lower back pain and chest pain that started this morning. Pt denies SHOB, nausea, or vomiting.

## 2023-05-18 NOTE — ED Provider Notes (Signed)
[place] EMERGENCY DEPARTMENT AT [facility] Provider Note   CSN: [id number] Arrival date & time: 05/18/23  1436     History Chief Complaint  Patient presents with   Back Pain    HPI [name] is a 27 y.o. female presenting for back pain. The patient presents today with a chief complaint of lower back pain and chest pains. They have a history of a severe infection a couple of years ago and are concerned about the possibility of another infection. The patient denies experiencing fevers, chills, nausea, or vomiting. They report that the pain is primarily located in the lower back region.  The patient has no known allergies and no other medical problems besides the previous infection. They have not experienced any leg swelling or other symptoms. The patient's last severe infection was in 2016, which was caused by E. coli and was sensitive to all medications.  Patient's recorded medical, surgical, social, medication list and allergies were reviewed in the Snapshot window as part of the initial history.   Review of Systems   Review of Systems  Physical Exam Updated Vital Signs BP 113/82 (BP Location: Right Arm)   Pulse 82   Temp 98.7 F (37.1 C) (Oral)   Resp 18   SpO2 100%  Physical Exam Vitals and nursing note reviewed.  Constitutional:      General: She is not in acute distress.    Appearance: She is well-developed.  HENT:     Head: Normocephalic and atraumatic.  Eyes:     Conjunctiva/sclera: Conjunctivae normal.  Cardiovascular:     Rate and Rhythm: Normal rate and regular rhythm.     Heart sounds: No murmur heard. Pulmonary:     Effort: Pulmonary effort is normal. No respiratory distress.     Breath sounds: Normal breath sounds.  Abdominal:     General: There is no distension.     Palpations: Abdomen is soft.     Tenderness: There is no abdominal tenderness. There is right CVA tenderness. There is no left CVA tenderness.  Musculoskeletal:         General: No swelling or tenderness. Normal range of motion.     Cervical back: Neck supple.  Skin:    General: Skin is warm and dry.  Neurological:     General: No focal deficit present.     Mental Status: She is alert and oriented to person, place, and time. Mental status is at baseline.     Cranial Nerves: No cranial nerve deficit.      ED Course/ Medical Decision Making/ A&P    Procedures Procedures   Medications Ordered in ED Medications - No data to display  Medical Decision Making:    [name] is a 27 y.o. female who presented to the ED today with concern for UTI detailed above.     Initial Assessment:   #Suspected Urinary Tract Infection (UTI):    - Patient presents with lower back pain and a history of a severe infection in the past.  Does not appear to be consistent with pyelonephritis based on reassuring vital signs.    - Urinalysis shows inflammatory cells and bacteria.    - Plan: Start empirical treatment with Keflex 500 mg three times a day, pending urine culture results. Follow up with primary care physician in 3-4 days to assess response to treatment and consider repeat urinalysis to ensure clearance of infection.  #Chest Pain:    - EKG shows no signs  of STEMI or abnormality    - Heart and lung sounds are normal.    - Plan: Continue to monitor for any changes or worsening of symptoms; if chest pain persists or worsens, consider further evaluation.   Initial Study Results:     - Urinalysis: Presence of inflammatory cells and bacteria.   - EKG: Normal with no signs of heart attack or strain.   - Past Urine Culture (2016): Growth of E. coli, sensitive to all tested medications.   - Current management includes sending a urine culture to identify present bacterial growth; results pending.  Disposition:  I have considered need for hospitalization, however, considering all of the above, I believe this patient is stable for discharge at this  time.  Patient/family educated about specific return precautions for given chief complaint and symptoms.  Patient/family educated about follow-up with PCP.     Patient/family expressed understanding of return precautions and need for follow-up. Patient spoken to regarding all imaging and laboratory results and appropriate follow up for these results. All education provided in verbal form with additional information in written form. Time was allowed for answering of patient questions. Patient discharged.    Emergency Department Medication Summary:   Medications - No data to display      Clinical Impression:  1. Urinary tract infection without hematuria, site unspecified      Discharge   Final Clinical Impression(s) / ED Diagnoses Final diagnoses:  Urinary tract infection without hematuria, site unspecified    Rx / DC Orders ED Discharge Orders          Ordered    cephALEXin (KEFLEX) 500 MG capsule  3 times daily        05/18/23 1744              Glyn Ade, MD 05/18/23 1750

## 2024-07-09 ENCOUNTER — Encounter (HOSPITAL_COMMUNITY): Payer: Self-pay

## 2024-07-09 ENCOUNTER — Emergency Department (HOSPITAL_COMMUNITY)
Admission: EM | Admit: 2024-07-09 | Discharge: 2024-07-09 | Disposition: A | Discharge status: Home / Self Care | Attending: Emergency Medicine | Admitting: Emergency Medicine

## 2024-07-09 ENCOUNTER — Other Ambulatory Visit: Payer: Self-pay

## 2024-07-09 DIAGNOSIS — N342 Other urethritis: Secondary | ICD-10-CM | POA: Diagnosis not present

## 2024-07-09 DIAGNOSIS — R350 Frequency of micturition: Secondary | ICD-10-CM | POA: Diagnosis present

## 2024-07-09 LAB — URINALYSIS, ROUTINE W REFLEX MICROSCOPIC
Bilirubin Urine: NEGATIVE
Glucose, UA: NEGATIVE mg/dL
Hgb urine dipstick: NEGATIVE
Ketones, ur: 5 mg/dL — AB
Nitrite: NEGATIVE
Protein, ur: 30 mg/dL — AB
Specific Gravity, Urine: 1.03 (ref 1.005–1.030)
pH: 5 (ref 5.0–8.0)

## 2024-07-09 LAB — WET PREP, GENITAL
Clue Cells Wet Prep HPF POC: NONE SEEN
Sperm: NONE SEEN
Trich, Wet Prep: NONE SEEN
WBC, Wet Prep HPF POC: >=10 — AB (ref <10)
Yeast Wet Prep HPF POC: NONE SEEN

## 2024-07-09 LAB — PREGNANCY, URINE: Preg Test, Ur: NEGATIVE

## 2024-07-09 MED ORDER — LIDOCAINE HCL (PF) 1 % IJ SOLN
INTRAMUSCULAR | Status: AC
  Filled 2024-07-09: qty 5

## 2024-07-09 MED ORDER — CEFTRIAXONE SODIUM 500 MG IJ SOLR
500.0000 mg | Freq: Once | INTRAMUSCULAR | Status: AC
  Administered 2024-07-09: 500 mg via INTRAMUSCULAR
  Filled 2024-07-09: qty 500

## 2024-07-09 MED ORDER — DOXYCYCLINE HYCLATE 100 MG PO CAPS: 100.0000 mg | ORAL_CAPSULE | Freq: Two times a day (BID) | ORAL | 0 refills | Status: AC

## 2024-07-09 NOTE — ED Triage Notes (Signed)
 Pt arrived via POV c/o urinary frequency, lower abdominal pain, reports having discharge X 2 days and reports she just started her menstrual cycle.

## 2024-07-09 NOTE — ED Provider Notes (Signed)
 [place] EMERGENCY DEPARTMENT AT [facility] Provider Note   CSN: [id number] Arrival date & time: 07/09/24  1842     Patient presents with: SEXUALLY TRANSMITTED DISEASE   [name] is a 28 y.o. female.   HPI     [name] is a 28 y.o. female who presents to the Emergency Department complaining of urinary frequency and vaginal discharge.  Symptoms present for 2 days.  She also complains of onset of her menstrual cycle yesterday and is having pelvic cramping which she states is not new for her during her cycle.  She endorses having abnormal vaginal discharge with the increased urinary frequency and recent unprotected intercourse.  Concern for possible STI.  She denies any flank or back pain, fever, chills, nausea vomiting, diarrhea or heavy vaginal bleeding or clots.  Requesting treatment for STI  Prior to Admission medications   Medication Sig Start Date End Date Taking? Authorizing Provider  doxycycline (VIBRAMYCIN) 100 MG capsule Take 1 capsule (100 mg total) by mouth 2 (two) times daily. 07/09/24  Yes Wyona Neils, PA-C  busPIRone  (BUSPAR ) 10 MG tablet Take 1 tablet (10 mg total) by mouth 3 (three) times daily. Patient not taking: Reported on 09/03/2021 08/26/21   Anyanwu, Ugonna A, MD  cephALEXin  (KEFLEX ) 500 MG capsule Take 1 capsule (500 mg total) by mouth 3 (three) times daily. 05/18/23   Jerral Meth, MD  enalapril  (VASOTEC ) 10 MG tablet TAKE 1 TABLET(10 MG) BY MOUTH DAILY 09/03/21   Jayne Vonn DEL, MD  furosemide  (LASIX ) 20 MG tablet Take 1 tablet (20 mg total) by mouth 2 (two) times daily for 5 days. 08/26/21 08/31/21  Anyanwu, Ugonna A, MD  ibuprofen  (ADVIL ) 600 MG tablet Take 1 tablet (600 mg total) by mouth every 6 (six) hours. 08/26/21   Anyanwu, Ugonna A, MD  NIFEdipine  (ADALAT  CC) 30 MG 24 hr tablet Take 1 tablet (30 mg total) by mouth daily. Patient not taking: Reported on 09/03/2021 08/27/21   Anyanwu, Ugonna A, MD  oxyCODONE  (OXY  IR/ROXICODONE ) 5 MG immediate release tablet Take 1 tablet (5 mg total) by mouth every 4 (four) hours as needed for severe pain. 08/26/21   Anyanwu, Ugonna A, MD  senna-docusate (SENOKOT-S) 8.6-50 MG tablet Take 2 tablets by mouth at bedtime as needed for mild constipation or moderate constipation. Patient not taking: Reported on 09/03/2021 08/26/21   Anyanwu, Ugonna A, MD    Allergies: Patient has no known allergies.    Review of Systems  Constitutional:  Negative for appetite change, chills and fever.  HENT:  Negative for sore throat and trouble swallowing.   Respiratory:  Negative for shortness of breath.   Cardiovascular:  Negative for chest pain.  Gastrointestinal:  Positive for abdominal pain. Negative for diarrhea, nausea and vomiting.  Genitourinary:  Positive for dysuria, frequency and vaginal discharge. Negative for decreased urine volume, difficulty urinating, flank pain, vaginal bleeding and vaginal pain.  Musculoskeletal:  Negative for back pain.  Neurological:  Negative for dizziness, weakness and numbness.    Updated Vital Signs BP (!) 145/89 (BP Location: Right Arm)   Pulse (!) 102   Temp 99.1 F (37.3 C) (Oral)   Resp 18   Ht 5' 1 (1.549 m)   Wt 56.7 kg   LMP 07/08/2024 (Exact Date)   SpO2 98%   BMI 23.62 kg/m   Physical Exam Vitals and nursing note reviewed.  Constitutional:      General: She is not in acute distress.  Appearance: Normal appearance. She is not ill-appearing or toxic-appearing.  Cardiovascular:     Rate and Rhythm: Normal rate and regular rhythm.     Pulses: Normal pulses.  Pulmonary:     Effort: Pulmonary effort is normal.  Abdominal:     Palpations: Abdomen is soft.     Tenderness: There is no abdominal tenderness. There is no right CVA tenderness, left CVA tenderness, guarding or rebound.  Musculoskeletal:        General: Normal range of motion.  Skin:    General: Skin is warm.     Capillary Refill: Capillary refill takes less  than 2 seconds.     Findings: No rash.  Neurological:     General: No focal deficit present.     Mental Status: She is alert.     Sensory: No sensory deficit.     Motor: No weakness.     (all labs ordered are listed, but only abnormal results are displayed) Labs Reviewed  WET PREP, GENITAL - Abnormal; Notable for the following components:      Result Value   WBC, Wet Prep HPF POC >=10 (*)    All other components within normal limits  URINALYSIS, ROUTINE W REFLEX MICROSCOPIC - Abnormal; Notable for the following components:   Color, Urine AMBER (*)    APPearance HAZY (*)    Ketones, ur 5 (*)    Protein, ur 30 (*)    Leukocytes,Ua TRACE (*)    Bacteria, UA RARE (*)    All other components within normal limits  URINE CULTURE  PREGNANCY, URINE  GC/CHLAMYDIA PROBE AMP ([place]) NOT AT [facility]    EKG: None  Radiology: No results found.   Procedures   Medications Ordered in the ED  lidocaine  (PF) (XYLOCAINE ) 1 % injection (has no administration in time range)  cefTRIAXone  (ROCEPHIN ) injection 500 mg (500 mg Intramuscular Given 07/09/24 2102)                                    Medical Decision Making Patient here for evaluation of possible STI.  Endorses abnormal vaginal discharge and recent unprotected intercourse.  She also has been experiencing increased urinary frequency.  States her partner is on antibiotics for UTI.  Patient well-appearing nontoxic.  Abdomen is soft and nontender on my exam.  No CVA tenderness on my exam.  Differential would include but not limited to UTI, STI, urethritis.  No significant tenderness of the abdomen or pelvis to suggest a PID TOA or torsion.  Patient endorses having IUD  She performed self swab for wet prep and GC chlamydia  Amount and/or Complexity of Data Reviewed Labs: ordered.    Details: Urine pregnancy is negative, urinalysis shows hazy urine with trace leukocytes and rare bacteria.  Culture is pending.  Wet prep negative, GC  chlamydia culture pending Discussion of management or test interpretation with external provider(s): Patient treated likely urethritis, given IM Rocephin  here and prescription for doxycycline.  She is ambulatory with steady gait.  Will follow up with OB/GYN and return precautions were given.  Risk Prescription drug management.        Final diagnoses:  Urethritis    ED Discharge Orders          Ordered    doxycycline (VIBRAMYCIN) 100 MG capsule  2 times daily        07/09/24 2134  Herlinda Milling, PA-C 07/09/24 2303    Towana Ozell BROCKS, MD 07/10/24 1000

## 2024-07-09 NOTE — ED Notes (Signed)
 Went to discharge pt and she was not in room.  E-mail with discharge instructions sent to pt to ensure she is aware that there is antibiotics she needs to take.

## 2024-07-09 NOTE — Discharge Instructions (Signed)
 You have been given antibiotics for your urinary symptoms and for possible urethritis.  Your cultures are pending.  You may review the results on MyChart in 2 to 3 days.  Please take the prescribed antibiotics as directed until finished.  You may follow-up with your primary care provider for recheck or return to the emergency department for any new or worsening symptoms

## 2024-07-10 LAB — GC/CHLAMYDIA PROBE AMP (~~LOC~~) NOT AT ARMC
Chlamydia: NEGATIVE
Comment: NEGATIVE
Comment: NORMAL
Neisseria Gonorrhea: POSITIVE — AB

## 2024-07-11 ENCOUNTER — Ambulatory Visit (HOSPITAL_COMMUNITY): Payer: Self-pay

## 2024-07-11 LAB — URINE CULTURE: Culture: NO GROWTH
